# Patient Record
Sex: Female | Born: 1955
Health system: Southern US, Community
[De-identification: ages and names within clinical notes are randomized; demographics above are authoritative.]

## PROBLEM LIST (undated history)

## (undated) DIAGNOSIS — E785 Hyperlipidemia, unspecified: Secondary | ICD-10-CM

## (undated) DIAGNOSIS — G5139 Clonic hemifacial spasm, unspecified: Secondary | ICD-10-CM

## (undated) DIAGNOSIS — I2510 Atherosclerotic heart disease of native coronary artery without angina pectoris: Secondary | ICD-10-CM

## (undated) HISTORY — PX: WISDOM TOOTH EXTRACTION: SHX21

## (undated) HISTORY — PX: TONSILLECTOMY AND ADENOIDECTOMY: SUR1326

## (undated) HISTORY — DX: Atherosclerotic heart disease of native coronary artery without angina pectoris: I25.10

## (undated) HISTORY — PX: RECTAL PROLAPSE REPAIR: SHX759

## (undated) HISTORY — DX: Clonic hemifacial spasm, unspecified: G51.39

## (undated) HISTORY — DX: Hyperlipidemia, unspecified: E78.5

---

## 2014-04-10 ENCOUNTER — Other Ambulatory Visit (HOSPITAL_COMMUNITY)
Admission: RE | Admit: 2014-04-10 | Discharge: 2014-04-10 | Disposition: A | Discharge status: Home / Self Care | Payer: 59 | Source: Ambulatory Visit | Attending: Family Medicine | Admitting: Family Medicine

## 2014-04-10 ENCOUNTER — Ambulatory Visit (INDEPENDENT_AMBULATORY_CARE_PROVIDER_SITE_OTHER): Payer: 59 | Admitting: Family Medicine

## 2014-04-10 ENCOUNTER — Encounter: Payer: Self-pay | Admitting: Family Medicine

## 2014-04-10 VITALS — BP 142/92 | HR 67 | Temp 98.1°F | Ht 63.0 in | Wt 175.5 lb

## 2014-04-10 DIAGNOSIS — Z136 Encounter for screening for cardiovascular disorders: Secondary | ICD-10-CM

## 2014-04-10 DIAGNOSIS — Z1211 Encounter for screening for malignant neoplasm of colon: Secondary | ICD-10-CM

## 2014-04-10 DIAGNOSIS — Z01419 Encounter for gynecological examination (general) (routine) without abnormal findings: Secondary | ICD-10-CM

## 2014-04-10 DIAGNOSIS — Z0000 Encounter for general adult medical examination without abnormal findings: Secondary | ICD-10-CM

## 2014-04-10 DIAGNOSIS — Z1151 Encounter for screening for human papillomavirus (HPV): Secondary | ICD-10-CM | POA: Insufficient documentation

## 2014-04-10 LAB — LIPID PANEL
CHOL/HDL RATIO: 4
Cholesterol: 204 mg/dL — ABNORMAL HIGH (ref 0–200)
HDL: 54.4 mg/dL (ref >39.00)
LDL CALC: 136 mg/dL — AB (ref 0–99)
NONHDL: 149.6
Triglycerides: 70 mg/dL (ref 0.0–149.0)
VLDL: 14 mg/dL (ref 0.0–40.0)

## 2014-04-10 LAB — CBC WITH DIFFERENTIAL/PLATELET
BASOS PCT: 0.6 % (ref 0.0–3.0)
Basophils Absolute: 0 10*3/uL (ref 0.0–0.1)
EOS ABS: 0.2 10*3/uL (ref 0.0–0.7)
EOS PCT: 4.1 % (ref 0.0–5.0)
HCT: 40.9 % (ref 36.0–46.0)
Hemoglobin: 13.9 g/dL (ref 12.0–15.0)
LYMPHS PCT: 22.7 % (ref 12.0–46.0)
Lymphs Abs: 1.1 10*3/uL (ref 0.7–4.0)
MCHC: 34.1 g/dL (ref 30.0–36.0)
MCV: 86.4 fl (ref 78.0–100.0)
MONO ABS: 0.5 10*3/uL (ref 0.1–1.0)
Monocytes Relative: 9.8 % (ref 3.0–12.0)
NEUTROS PCT: 62.8 % (ref 43.0–77.0)
Neutro Abs: 3.1 10*3/uL (ref 1.4–7.7)
Platelets: 226 10*3/uL (ref 150.0–400.0)
RBC: 4.73 Mil/uL (ref 3.87–5.11)
RDW: 14.1 % (ref 11.5–15.5)
WBC: 5 10*3/uL (ref 4.0–10.5)

## 2014-04-10 LAB — COMPREHENSIVE METABOLIC PANEL
ALBUMIN: 4.2 g/dL (ref 3.5–5.2)
ALK PHOS: 81 U/L (ref 39–117)
ALT: 19 U/L (ref 0–35)
AST: 21 U/L (ref 0–37)
BUN: 13 mg/dL (ref 6–23)
CALCIUM: 9.3 mg/dL (ref 8.4–10.5)
CO2: 29 mEq/L (ref 19–32)
Chloride: 104 mEq/L (ref 96–112)
Creatinine, Ser: 0.6 mg/dL (ref 0.4–1.2)
GFR: 107.7 mL/min (ref >60.00)
Glucose, Bld: 110 mg/dL — ABNORMAL HIGH (ref 70–99)
Potassium: 3.8 mEq/L (ref 3.5–5.1)
SODIUM: 139 meq/L (ref 135–145)
TOTAL PROTEIN: 6.9 g/dL (ref 6.0–8.3)
Total Bilirubin: 0.7 mg/dL (ref 0.2–1.2)

## 2014-04-10 LAB — TSH: TSH: 1.56 u[IU]/mL (ref 0.35–4.50)

## 2014-04-10 NOTE — Patient Instructions (Signed)
Great to meet you. We will call you with your lab results.  You can also view them online.  Please think about getting a colonoscopy.  We can order this for you.

## 2014-04-10 NOTE — Assessment & Plan Note (Signed)
Reviewed preventive care protocols, scheduled due services, and updated immunizations Discussed nutrition, exercise, diet, and healthy lifestyle.  Orders Placed This Encounter  Procedures  . Fecal occult blood, imunochemical  . CBC with Differential  . Comprehensive metabolic panel  . Lipid panel  . TSH

## 2014-04-10 NOTE — Progress Notes (Signed)
Subjective:   Patient ID: Betty Smith, female    DOB: 1956/10/06, 58 y.o.   MRN: [id number]  Betty Smith is a pleasant 58 y.o. year old female year old female who presents to clinic today with Establish Care and Annual Exam  on 04/10/2014  HPI:  G12P12 here to establish care and for CPX. Has not seen a doctor in years.  LMP 3 years ago- no h/o postmenopausal bleeding.  Did have a rectal prolapse - [place]  Has not had a mammogram in over 10 years- she is refusing.  Only family h/o CA is breast CA in maternal grand mother who was in her 8170s at the time.  Denies any changes in her bowel habits or blood in her stool.  Has not had a colonoscopy since she turned 52. Denies any dysuria or vaginal discharge.  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  No current outpatient prescriptions on file prior to visit.   No current facility-administered medications on file prior to visit.    No Known Allergies  Past Medical History  Diagnosis Date  . GERD (gastroesophageal reflux disease)   . Hypertension   . Frequent headaches     hx of    Past Surgical History  Procedure Laterality Date  . Cesarean section  1983, 1985  . Tonsillectomy and adenoidectomy    . Wisdom tooth extraction    . Tooth extraction      Family History  Problem Relation Age of Onset  . Arthritis Mother   . Cancer Mother   . Cancer Father   . Cancer Brother   . Stroke Brother   . Cancer Maternal Grandmother     History   Social History  . Marital Status: Married    Spouse Name: N/A    Number of Children: N/A  . Years of Education: N/A   Occupational History  . Not on file.   Social History Main Topics  . Smoking status: Former Games developermoker  . Smokeless tobacco: Never Used  . Alcohol Use: Yes  . Drug Use: No  . Sexual Activity: No   Other Topics Concern  . Not on file   Social History Narrative  . No narrative on file   The PMH, PSH, Social  History, Family History, Medications, and allergies [facility], and have been updated if relevant.    Review of Systems    See HPI Patient reports no  vision/ hearing changes,anorexia, weight change, fever ,adenopathy, persistant / recurrent hoarseness, swallowing issues, chest pain, edema,persistant / recurrent cough, hemoptysis, dyspnea(rest, exertional, paroxysmal nocturnal), gastrointestinal  bleeding (melena, rectal bleeding), abdominal pain, excessive heart burn, GU symptoms(dysuria, hematuria, pyuria, voiding/incontinence  Issues) syncope, focal weakness, severe memory loss, concerning skin lesions, depression, anxiety, abnormal bruising/bleeding, major joint swelling, breast masses or abnormal vaginal bleeding.    Objective:    BP 142/92  Pulse 67  Temp(Src) 98.1 F (36.7 C) (Oral)  Ht 5\' 3"  (1.6 m)  Wt 175 lb 8 oz (79.606 kg)  BMI 31.10 kg/m2  SpO2 97%   Physical Exam    General:  Well-developed,well-nourished,in no acute distress; alert,appropriate and cooperative throughout examination Head:  normocephalic and atraumatic.   Eyes:  vision grossly intact, pupils equal, pupils round, and pupils reactive to light.   Ears:  R ear normal and L ear normal.   Nose:  no external deformity.   Mouth:  good dentition.   Neck:  No deformities, masses, or tenderness noted. Breasts:  No mass, nodules, thickening, tenderness, bulging, retraction, inflamation, nipple discharge or skin changes noted.   Lungs:  Normal respiratory effort, chest expands symmetrically. Lungs are clear to auscultation, no crackles or wheezes. Heart:  Normal rate and regular rhythm. S1 and S2 normal without gallop, murmur, click, rub or other extra sounds. Abdomen:  Bowel sounds positive,abdomen soft and non-tender without masses, organomegaly or hernias noted. Rectal:  no external abnormalities.   Genitalia:  Pelvic Exam:        External: normal female genitalia without lesions or masses         Vagina: normal without lesions or masses        Cervix: normal without lesions or masses        Adnexa: normal bimanual exam without masses or fullness        Uterus: normal by palpation        Pap smear: performed Msk:  No deformity or scoliosis noted of thoracic or lumbar spine.   Extremities:  No clubbing, cyanosis, edema, or deformity noted with normal full range of motion of all joints.   Neurologic:  alert & oriented X3 and gait normal.   Skin:  Intact without suspicious lesions or rashes Cervical Nodes:  No lymphadenopathy noted Axillary Nodes:  No palpable lymphadenopathy Psych:  Cognition and judgment appear intact. Alert and cooperative with normal attention span and concentration. No apparent delusions, illusions, hallucinations      Assessment & Plan:   Encounter for routine gynecological examination  Routine general medical examination at a health care facility No Follow-up on file.

## 2014-04-10 NOTE — Progress Notes (Signed)
Pre visit review using our clinic review tool, if applicable. No additional management support is needed unless otherwise documented below in the visit note. 

## 2014-04-10 NOTE — Assessment & Plan Note (Signed)
Pap smear today.  Declining mammogram. IFOB ordered- wants to think about colonoscopy.

## 2014-04-11 LAB — CYTOLOGY - PAP

## 2014-04-15 ENCOUNTER — Encounter: Payer: Self-pay | Admitting: *Deleted

## 2014-04-16 ENCOUNTER — Other Ambulatory Visit (INDEPENDENT_AMBULATORY_CARE_PROVIDER_SITE_OTHER): Payer: 59

## 2014-04-16 DIAGNOSIS — Z0000 Encounter for general adult medical examination without abnormal findings: Secondary | ICD-10-CM

## 2014-04-16 LAB — FECAL OCCULT BLOOD, IMMUNOCHEMICAL: FECAL OCCULT BLD: NEGATIVE

## 2014-04-16 NOTE — Addendum Note (Signed)
Addended by: Desmond DikeKNIGHT, Renleigh Ouellet H on: 04/16/2014 02:08 PM   Modules accepted: Orders

## 2014-04-17 ENCOUNTER — Encounter: Payer: Self-pay | Admitting: *Deleted

## 2014-09-25 ENCOUNTER — Ambulatory Visit (INDEPENDENT_AMBULATORY_CARE_PROVIDER_SITE_OTHER): Payer: 59 | Admitting: Family Medicine

## 2014-09-25 ENCOUNTER — Encounter: Payer: Self-pay | Admitting: Family Medicine

## 2014-09-25 VITALS — BP 144/86 | HR 68 | Temp 98.1°F | Wt 180.2 lb

## 2014-09-25 DIAGNOSIS — R030 Elevated blood-pressure reading, without diagnosis of hypertension: Secondary | ICD-10-CM | POA: Insufficient documentation

## 2014-09-25 NOTE — Assessment & Plan Note (Signed)
New- BP closer to her normal range/normotensive here today.Likely due to recent stressors and acute anxiety related to her dental work. Advised to cut back on salt intake- given handout on DASH diet, and increasing physical activity. Advised getting a home BP cuff. No rx given today. Follow up in 2 in two months, sooner if symptoms deteriorate.

## 2014-09-25 NOTE — Patient Instructions (Signed)
Great to see you. I am so sorry for your loss.  Have a nice Christmas and say hi to your family for me.  Please come see me after the holidays and we will recheck your blood pressure.  Cut back on salt in your diet and exercise more.  OMRON is my favorite brand of BP machines.  DASH Eating Plan DASH stands for "Dietary Approaches to Stop Hypertension." The DASH eating plan is a healthy eating plan that has been shown to reduce high blood pressure (hypertension). Additional health benefits may include reducing the risk of type 2 diabetes mellitus, heart disease, and stroke. The DASH eating plan may also help with weight loss. WHAT DO I NEED TO KNOW ABOUT THE DASH EATING PLAN? For the DASH eating plan, you will follow these general guidelines:  Choose foods with a percent daily value for sodium of less than 5% (as listed on the food label).  Use salt-free seasonings or herbs instead of table salt or sea salt.  Check with your health care provider or pharmacist before using salt substitutes.  Eat lower-sodium products, often labeled as "lower sodium" or "no salt added."  Eat fresh foods.  Eat more vegetables, fruits, and low-fat dairy products.  Choose whole grains. Look for the word "whole" as the first word in the ingredient list.  Choose fish and skinless chicken or [place]key more often than red meat. Limit fish, poultry, and meat to 6 oz (170 g) each day.  Limit sweets, desserts, sugars, and sugary drinks.  Choose heart-healthy fats.  Limit cheese to 1 oz (28 g) per day.  Eat more home-cooked food and less restaurant, buffet, and fast food.  Limit fried foods.  Cook foods using methods other than frying.  Limit canned vegetables. If you do use them, rinse them well to decrease the sodium.  When eating at a restaurant, ask that your food be prepared with less salt, or no salt if possible. WHAT FOODS CAN I EAT? Seek help from a dietitian for individual calorie  needs. Grains Whole grain or whole wheat bread. Brown rice. Whole grain or whole wheat pasta. Quinoa, bulgur, and whole grain cereals. Low-sodium cereals. Corn or whole wheat flour tortillas. Whole grain cornbread. Whole grain crackers. Low-sodium crackers. Vegetables Fresh or frozen vegetables (raw, steamed, roasted, or grilled). Low-sodium or reduced-sodium tomato and vegetable juices. Low-sodium or reduced-sodium tomato sauce and paste. Low-sodium or reduced-sodium canned vegetables.  Fruits All fresh, canned (in natural juice), or frozen fruits. Meat and Other Protein Products Ground beef (85% or leaner), grass-fed beef, or beef trimmed of fat. Skinless chicken or [place]key. Ground chicken or [place]key. Pork trimmed of fat. All fish and seafood. Eggs. Dried beans, peas, or lentils. Unsalted nuts and seeds. Unsalted canned beans. Dairy Low-fat dairy products, such as skim or 1% milk, 2% or reduced-fat cheeses, low-fat ricotta or cottage cheese, or plain low-fat yogurt. Low-sodium or reduced-sodium cheeses. Fats and Oils Tub margarines without trans fats. Light or reduced-fat mayonnaise and salad dressings (reduced sodium). Avocado. Safflower, olive, or canola oils. Natural peanut or almond butter. Other Unsalted popcorn and pretzels. The items listed above may not be a complete list of recommended foods or beverages. Contact your dietitian for more options. WHAT FOODS ARE NOT RECOMMENDED? Grains White bread. White pasta. White rice. Refined cornbread. Bagels and croissants. Crackers that contain trans fat. Vegetables Creamed or fried vegetables. Vegetables in a cheese sauce. Regular canned vegetables. Regular canned tomato sauce and paste. Regular tomato and vegetable juices.  Fruits Dried fruits. Canned fruit in light or heavy syrup. Fruit juice. Meat and Other Protein Products Fatty cuts of meat. Ribs, chicken wings, bacon, sausage, bologna, salami, chitterlings, fatback, hot dogs, bratwurst,  and packaged luncheon meats. Salted nuts and seeds. Canned beans with salt. Dairy Whole or 2% milk, cream, half-and-half, and cream cheese. Whole-fat or sweetened yogurt. Full-fat cheeses or blue cheese. Nondairy creamers and whipped toppings. Processed cheese, cheese spreads, or cheese curds. Condiments Onion and garlic salt, seasoned salt, table salt, and sea salt. Canned and packaged gravies. Worcestershire sauce. Tartar sauce. Barbecue sauce. Teriyaki sauce. Soy sauce, including reduced sodium. Steak sauce. Fish sauce. Oyster sauce. Cocktail sauce. Horseradish. Ketchup and mustard. Meat flavorings and tenderizers. Bouillon cubes. Hot sauce. Tabasco sauce. Marinades. Taco seasonings. Relishes. Fats and Oils Butter, stick margarine, lard, shortening, ghee, and bacon fat. Coconut, palm kernel, or palm oils. Regular salad dressings. Other Pickles and olives. Salted popcorn and pretzels. The items listed above may not be a complete list of foods and beverages to avoid. Contact your dietitian for more information. WHERE CAN I FIND MORE INFORMATION? National Heart, Lung, and Blood Institute: CableProm[employer].itwww.nhlbi.nih.gov/health/health-topics/topics/dash/ Document Released: 09/16/2011 Document Revised: 02/11/2014 Document Reviewed: 08/01/2013 [facility] Patient Information 2015 [employer] [place]. This information is not intended to replace advice given to you by your health care provider. Make sure you discuss any questions you have with your health care provider.

## 2014-09-25 NOTE — Progress Notes (Signed)
Pre visit review using our clinic review tool, if applicable. No additional management support is needed unless otherwise documented below in the visit note. 

## 2014-09-25 NOTE — Progress Notes (Signed)
Subjective:   Patient ID: Betty OberSusan Bouza, female    DOB: 10-Feb-1956, 58 y.o.   MRN: [id number]  Betty Smith is a pleasant 58 y.o. year old female who presents to clinic today with Hypertension  on 09/25/2014  HPI: Here for ? HTN  No PMH of HTN but has always had a "higher" normal readings- 130s- 140s systolic.  Was 160/100 at dentist. She was very nervous about having dental work done. Asymptomatic other than some eye pain after a very long day at work.  Has not had any since. No HA, blurred vision, CP or SOB. No LE edema.    Has been under more stress- her mother recently diet.  Gained some weight, eating more salt.  BP Readings from Last 3 Encounters:  09/25/14 144/86  04/10/14 142/92   Lab Results  Component Value Date   CREATININE 0.6 04/10/2014   No current outpatient prescriptions on file prior to visit.   No current facility-administered medications on file prior to visit.    No Known Allergies  Past Medical History  Diagnosis Date  . GERD (gastroesophageal reflux disease)   . Hypertension   . Frequent headaches     hx of    Past Surgical History  Procedure Laterality Date  . Cesarean section  1983, 1985  . Tonsillectomy and adenoidectomy    . Wisdom tooth extraction    . Tooth extraction      Family History  Problem Relation Age of Onset  . Arthritis Mother   . Cancer Mother   . Cancer Father   . Cancer Brother   . Stroke Brother   . Cancer Maternal Grandmother     History   Social History  . Marital Status: Married    Spouse Name: N/A    Number of Children: N/A  . Years of Education: N/A   Occupational History  . Not on file.   Social History Main Topics  . Smoking status: Former Games developermoker  . Smokeless tobacco: Never Used  . Alcohol Use: Yes  . Drug Use: No  . Sexual Activity: No   Other Topics Concern  . Not on file   Social History Narrative   The PMH, PSH, Social History, Family History, Medications, and allergies have been  [facility], and have been updated if relevant.     Review of Systems  Constitutional: Negative.   HENT: Negative.   Eyes: Negative.   Respiratory: Negative.   Cardiovascular: Negative.   Gastrointestinal: Negative.   Musculoskeletal: Negative.   Skin: Negative.   Neurological: Negative.   Hematological: Negative.   All other systems reviewed and are negative.      Objective:    BP 144/86 mmHg  Pulse 68  Temp(Src) 98.1 F (36.7 C) (Oral)  Wt 180 lb 4 oz (81.761 kg)  SpO2 98% Wt Readings from Last 3 Encounters:  09/25/14 180 lb 4 oz (81.761 kg)  04/10/14 175 lb 8 oz (79.606 kg)     Physical Exam  Constitutional: She is oriented to person, place, and time. She appears well-developed and well-nourished. No distress.  Eyes: Pupils are equal, round, and reactive to light.  Neck: Normal range of motion.  Cardiovascular: Normal rate.   Pulmonary/Chest: Effort normal.  Neurological: She is alert and oriented to person, place, and time.  Skin: Skin is warm and dry.  Psychiatric: She has a normal mood and affect. Her behavior is normal. Judgment and thought content normal.  Nursing note and vitals reviewed.  Assessment & Plan:   Elevated blood pressure (not hypertension) No Follow-up on file.

## 2015-03-26 ENCOUNTER — Encounter: Payer: Self-pay | Admitting: Internal Medicine

## 2015-03-26 ENCOUNTER — Ambulatory Visit (INDEPENDENT_AMBULATORY_CARE_PROVIDER_SITE_OTHER): Payer: 59 | Admitting: Internal Medicine

## 2015-03-26 VITALS — BP 124/82 | HR 71 | Temp 98.1°F | Wt 167.0 lb

## 2015-03-26 DIAGNOSIS — R0781 Pleurodynia: Secondary | ICD-10-CM | POA: Diagnosis not present

## 2015-03-26 NOTE — Progress Notes (Signed)
Subjective:    Patient ID: Betty Smith, female    DOB: 09-28-1956, 59 y.o.   MRN: [id number]  HPI  Pt presents to the clinic today with c/o a fall. This occurred 2 days ago. She was walking toward her house, when she tripped and fell forward. She did catch herself with her hands and knees. She now c/o pain in her left side/back. The pain is sharp and shooting. The pain hurts worse with deep breath and certain movements. She has tried topical menthol with some relief. She has no history of breathing problems. She is concerned that she may have broken a rib.  Review of Systems      Past Medical History  Diagnosis Date  . GERD (gastroesophageal reflux disease)   . Hypertension   . Frequent headaches     hx of    No current outpatient prescriptions on file.   No current facility-administered medications for this visit.    No Known Allergies  Family History  Problem Relation Age of Onset  . Arthritis Mother   . Cancer Mother   . Cancer Father   . Cancer Brother   . Stroke Brother   . Cancer Maternal Grandmother     History   Social History  . Marital Status: Married    Spouse Name: N/A  . Number of Children: N/A  . Years of Education: N/A   Occupational History  . Not on file.   Social History Main Topics  . Smoking status: Former Games developer  . Smokeless tobacco: Never Used  . Alcohol Use: Yes  . Drug Use: No  . Sexual Activity: No   Other Topics Concern  . Not on file   Social History Narrative     Constitutional: Denies fever, malaise, fatigue, headache or abrupt weight changes.  Respiratory: Denies difficulty breathing, shortness of breath, cough or sputum production.  Cardiovascular: Denies chest pain, chest tightness, palpitations or swelling in the hands or feet.  Gastrointestinal: Denies abdominal pain, bloating, constipation, diarrhea or blood in the stool.  GU: Denies urgency, frequency, pain with urination, burning sensation, blood in urine, odor  or discharge. Musculoskeletal: Pt reports pain in left side/back. Denies decrease in range of motion, difficulty with gait, or joint pain and swelling.  Skin: Denies redness, rashes, lesions or ulcercations.    No other specific complaints in a complete review of systems (except as listed in HPI above).  Objective:   Physical Exam    BP 124/82 mmHg  Pulse 71  Temp(Src) 98.1 F (36.7 C) (Oral)  Wt 167 lb (75.751 kg)  SpO2 98% Wt Readings from Last 3 Encounters:  03/26/15 167 lb (75.751 kg)  09/25/14 180 lb 4 oz (81.761 kg)  04/10/14 175 lb 8 oz (79.606 kg)    General: Appears her stated age, well developed, well nourished in NAD. Skin: Warm, dry and intact. No bruising noted. Cardiovascular: Normal rate and rhythm. S1,S2 noted.   Pulmonary/Chest: Normal effort and positive vesicular breath sounds. No respiratory distress. No wheezes, rales or ronchi noted.  Abdomen: Soft and nontender. Normal bowel sounds, no bruits noted. No distention or masses noted.  Musculoskeletal: Pain reproduced with palpation of the left lateral back/side, directly over the ribs. Some laxity noted but deformity felt.  BMET    Component Value Date/Time   NA 139 04/10/2014 1142   K 3.8 04/10/2014 1142   CL 104 04/10/2014 1142   CO2 29 04/10/2014 1142   GLUCOSE 110* 04/10/2014 1142  BUN 13 04/10/2014 1142   CREATININE 0.6 04/10/2014 1142   CALCIUM 9.3 04/10/2014 1142    Lipid Panel     Component Value Date/Time   CHOL 204* 04/10/2014 1142   TRIG 70.0 04/10/2014 1142   HDL 54.40 04/10/2014 1142   CHOLHDL 4 04/10/2014 1142   VLDL 14.0 04/10/2014 1142   LDLCALC 136* 04/10/2014 1142    CBC    Component Value Date/Time   WBC 5.0 04/10/2014 1142   RBC 4.73 04/10/2014 1142   HGB 13.9 04/10/2014 1142   HCT 40.9 04/10/2014 1142   PLT 226.0 04/10/2014 1142   MCV 86.4 04/10/2014 1142   MCHC 34.1 04/10/2014 1142   RDW 14.1 04/10/2014 1142   LYMPHSABS 1.1 04/10/2014 1142   MONOABS 0.5  04/10/2014 1142   EOSABS 0.2 04/10/2014 1142   BASOSABS 0.0 04/10/2014 1142    Hgb A1C No results found for: HGBA1C     Assessment & Plan:   Left side rib pain:  Likely a rib fracture Discussed the possibility of xray the left side of the ribs, but why expose her to the radiation when it would not change the treatment Discussed why we do not bind the ribs anymore, secondary to increased risk for pneumonia. She will continue with the menthol rub, Ibuprofen and a heating pad Reassured her that this should resolve within 3-4 weeks  RTC as needed or if symptoms persist or worsen

## 2015-03-26 NOTE — Patient Instructions (Signed)

## 2015-05-01 ENCOUNTER — Telehealth: Payer: Self-pay

## 2015-05-01 NOTE — Telephone Encounter (Signed)
Left a message for patient to return my call about having a Mammogram set up. Will await call back.  

## 2015-05-22 ENCOUNTER — Other Ambulatory Visit: Payer: Self-pay | Admitting: Internal Medicine

## 2015-05-22 DIAGNOSIS — Z0000 Encounter for general adult medical examination without abnormal findings: Secondary | ICD-10-CM

## 2015-06-02 ENCOUNTER — Other Ambulatory Visit (INDEPENDENT_AMBULATORY_CARE_PROVIDER_SITE_OTHER): Payer: 59

## 2015-06-02 DIAGNOSIS — Z0000 Encounter for general adult medical examination without abnormal findings: Secondary | ICD-10-CM

## 2015-06-02 LAB — COMPREHENSIVE METABOLIC PANEL
ALT: 13 U/L (ref 0–35)
AST: 15 U/L (ref 0–37)
Albumin: 4.1 g/dL (ref 3.5–5.2)
Alkaline Phosphatase: 89 U/L (ref 39–117)
BUN: 13 mg/dL (ref 6–23)
CHLORIDE: 104 meq/L (ref 96–112)
CO2: 28 meq/L (ref 19–32)
CREATININE: 0.61 mg/dL (ref 0.40–1.20)
Calcium: 9.3 mg/dL (ref 8.4–10.5)
GFR: 107.26 mL/min (ref >60.00)
Glucose, Bld: 98 mg/dL (ref 70–99)
Potassium: 4 mEq/L (ref 3.5–5.1)
Sodium: 140 mEq/L (ref 135–145)
Total Bilirubin: 0.5 mg/dL (ref 0.2–1.2)
Total Protein: 6.5 g/dL (ref 6.0–8.3)

## 2015-06-02 LAB — LIPID PANEL
CHOL/HDL RATIO: 4
Cholesterol: 195 mg/dL (ref 0–200)
HDL: 52.6 mg/dL (ref >39.00)
LDL CALC: 129 mg/dL — AB (ref 0–99)
NonHDL: 142.07
Triglycerides: 67 mg/dL (ref 0.0–149.0)
VLDL: 13.4 mg/dL (ref 0.0–40.0)

## 2015-06-02 LAB — CBC
HCT: 41 % (ref 36.0–46.0)
Hemoglobin: 13.9 g/dL (ref 12.0–15.0)
MCHC: 33.9 g/dL (ref 30.0–36.0)
MCV: 86.8 fl (ref 78.0–100.0)
Platelets: 221 10*3/uL (ref 150.0–400.0)
RBC: 4.73 Mil/uL (ref 3.87–5.11)
RDW: 13.8 % (ref 11.5–15.5)
WBC: 5.1 10*3/uL (ref 4.0–10.5)

## 2015-06-02 LAB — TSH: TSH: 2.3 u[IU]/mL (ref 0.35–4.50)

## 2015-06-05 ENCOUNTER — Ambulatory Visit (INDEPENDENT_AMBULATORY_CARE_PROVIDER_SITE_OTHER): Payer: 59 | Admitting: Family Medicine

## 2015-06-05 ENCOUNTER — Encounter: Payer: Self-pay | Admitting: Family Medicine

## 2015-06-05 VITALS — BP 128/82 | HR 68 | Temp 98.3°F | Ht 63.0 in | Wt 171.5 lb

## 2015-06-05 DIAGNOSIS — Z1211 Encounter for screening for malignant neoplasm of colon: Secondary | ICD-10-CM | POA: Diagnosis not present

## 2015-06-05 DIAGNOSIS — Z0000 Encounter for general adult medical examination without abnormal findings: Secondary | ICD-10-CM | POA: Diagnosis not present

## 2015-06-05 DIAGNOSIS — Z01419 Encounter for gynecological examination (general) (routine) without abnormal findings: Secondary | ICD-10-CM

## 2015-06-05 NOTE — Progress Notes (Signed)
Subjective:   Patient ID: Betty Smith, female    DOB: [birth year], 59 y.o.   MRN: [id number]  Megean Fabio is a pleasant 59 y.o. year old female who presents to clinic today with Annual Exam and Flank Pain  on 06/05/2015  HPI:  G12P12 - doing well.  LMP 4 or 5  years ago- no h/o postmenopausal bleeding.  Did have a rectal prolapse - had surgery in 2003 in [place]. Pap smear negative last year- 04/15/14.  Has not had a mammogram in over 10 years- she is refusing. Only family h/o CA is breast CA in maternal grand mother who was in her 29s at the time.  Has not had a colonoscopy since she turned 52. IFOB negative 04/16/2014, willing to continue yearly stool cards. Denies any changes in her bowel habits or blood in her stool.  Denies any dysuria or vaginal discharge.  Has been working on losing weight.  She remains very active. Wt Readings from Last 3 Encounters:  06/05/15 171 lb 8 oz (77.792 kg)  03/26/15 167 lb (75.751 kg)  09/25/14 180 lb 4 oz (81.761 kg)    Lab Results  Component Value Date   CREATININE 0.61 06/02/2015    Lab Results  Component Value Date   WBC 5.1 06/02/2015   HGB 13.9 06/02/2015   HCT 41.0 06/02/2015   MCV 86.8 06/02/2015   PLT 221.0 06/02/2015   Lab Results  Component Value Date   TSH 2.30 06/02/2015   Lab Results  Component Value Date   CHOL 195 06/02/2015   HDL 52.60 06/02/2015   LDLCALC 129* 06/02/2015   TRIG 67.0 06/02/2015   CHOLHDL 4 06/02/2015   Lab Results  Component Value Date   ALT 13 06/02/2015   AST 15 06/02/2015   ALKPHOS 89 06/02/2015   BILITOT 0.5 06/02/2015    No CP or SOB- 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  No current outpatient prescriptions on file prior to visit.   No current facility-administered medications on file prior to visit.    No Known Allergies  Past Medical History  Diagnosis Date  . GERD (gastroesophageal reflux disease)   . Hypertension   . Frequent  headaches     hx of    Past Surgical History  Procedure Laterality Date  . Cesarean section  1983, 1985  . Tonsillectomy and adenoidectomy    . Wisdom tooth extraction    . Tooth extraction      Family History  Problem Relation Age of Onset  . Arthritis Mother   . Cancer Mother   . Cancer Father   . Cancer Brother   . Stroke Brother   . Cancer Maternal Grandmother     Social History   Social History  . Marital Status: Married    Spouse Name: N/A  . Number of Children: N/A  . Years of Education: N/A   Occupational History  . Not on file.   Social History Main Topics  . Smoking status: Former Games developer  . Smokeless tobacco: Never Used  . Alcohol Use: Yes  . Drug Use: No  . Sexual Activity: No   Other Topics Concern  . Not on file   Social History Narrative   The PMH, PSH, Social History, Family History, Medications, and allergies have been reviewed in [facility], and have been updated if relevant.  Review of Systems  Constitutional: Negative.   HENT: Negative.   Eyes: Negative.   Respiratory: Negative.  Cardiovascular: Negative.   Gastrointestinal: Negative.   Endocrine: Negative.   Genitourinary: Negative.   Musculoskeletal: Negative.   Skin: Negative.   Allergic/Immunologic: Negative.   Neurological: Negative.   Hematological: Negative.   Psychiatric/Behavioral: Negative.   All other systems reviewed and are negative.      Objective:    BP 128/82 mmHg  Pulse 68  Temp(Src) 98.3 F (36.8 C) (Oral)  Ht  (1.6 m)  Wt 171 lb 8 oz (77.792 kg)  BMI 30.39 kg/m2  SpO2 98%  Wt Readings from Last 3 Encounters:  06/05/15 171 lb 8 oz (77.792 kg)  03/26/15 167 lb (75.751 kg)  09/25/14 180 lb 4 oz (81.761 kg)    Physical Exam  Constitutional: She is oriented to person, place, and time. She appears well-developed and well-nourished. No distress.  HENT:  Head: Normocephalic and atraumatic.  Eyes: Conjunctivae are normal.  Neck: Normal range of motion.  Neck supple.  Cardiovascular: Normal rate, regular rhythm and normal heart sounds.   Pulmonary/Chest: Effort normal. Right breast exhibits no inverted nipple, no mass, no nipple discharge, no skin change and no tenderness. Left breast exhibits no inverted nipple, no mass, no nipple discharge, no skin change and no tenderness. Breasts are symmetrical.  Abdominal: Soft. Bowel sounds are normal. She exhibits no distension and no mass. There is no tenderness. There is no rebound and no guarding.  Musculoskeletal: Normal range of motion. She exhibits no edema.  Neurological: She is alert and oriented to person, place, and time. No cranial nerve deficit.  Skin: Skin is warm and dry.  Psychiatric: She has a normal mood and affect. Her behavior is normal. Judgment and thought content normal.  Nursing note and vitals reviewed.         Assessment & Plan:   Well woman exam No Follow-up on file.

## 2015-06-05 NOTE — Assessment & Plan Note (Signed)
Reviewed preventive care protocols, scheduled due services, and updated immunizations Discussed nutrition, exercise, diet, and healthy lifestyle.  IFOB.  Declines mammogram, colonoscopy, Hep C, HIV screening. Also declines tetanus immunization.

## 2015-06-05 NOTE — Progress Notes (Signed)
Pre visit review using our clinic review tool, if applicable. No additional management support is needed unless otherwise documented below in the visit note. 

## 2015-09-09 ENCOUNTER — Encounter: Payer: Self-pay | Admitting: Family Medicine

## 2015-09-09 ENCOUNTER — Ambulatory Visit (INDEPENDENT_AMBULATORY_CARE_PROVIDER_SITE_OTHER): Payer: 59 | Admitting: Family Medicine

## 2015-09-09 VITALS — BP 128/74 | HR 67 | Temp 98.4°F | Wt 178.0 lb

## 2015-09-09 DIAGNOSIS — G518 Other disorders of facial nerve: Secondary | ICD-10-CM

## 2015-09-09 DIAGNOSIS — G5139 Clonic hemifacial spasm, unspecified: Secondary | ICD-10-CM | POA: Insufficient documentation

## 2015-09-09 NOTE — Assessment & Plan Note (Signed)
New- progressive. Refer to neurology for EMG and further work up. The patient indicates understanding of these issues and agrees with the plan.

## 2015-09-09 NOTE — Progress Notes (Signed)
Pre visit review using our clinic review tool, if applicable. No additional management support is needed unless otherwise documented below in the visit note. 

## 2015-09-09 NOTE — Progress Notes (Signed)
   Subjective:    Patient ID: Betty Smith, female    DOB: April 11, 1956, 59 y.o.   MRN: [id number]  HPI 4 months of left sided facial spasms.  Started with her eye only- I advised that she cut back on coffee.  Now, she feels like her entire left side of her face is twitching constantly, worse with chewing and talking.  Feels left side of her face is throbbing, not necessarily numb.  Cutting back on caffeine has not helped.  It is now progressing.  No drooling or difficulty swallowing.  No hearing loss or blurred vision.  No twitching or weakness of other body parts.  Never had anything like this in past.  No current outpatient prescriptions on file prior to visit.   No current facility-administered medications on file prior to visit.    No Known Allergies  Past Medical History  Diagnosis Date  . GERD (gastroesophageal reflux disease)   . Hypertension   . Frequent headaches     hx of    Past Surgical History  Procedure Laterality Date  . Cesarean section  1983, 1985  . Tonsillectomy and adenoidectomy    . Wisdom tooth extraction    . Tooth extraction      Family History  Problem Relation Age of Onset  . Arthritis Mother   . Cancer Mother   . Cancer Father   . Cancer Brother   . Stroke Brother   . Cancer Maternal Grandmother     Social History   Social History  . Marital Status: Married    Spouse Name: N/A  . Number of Children: N/A  . Years of Education: N/A   Occupational History  . Not on file.   Social History Main Topics  . Smoking status: Former Games developermoker  . Smokeless tobacco: Never Used  . Alcohol Use: Yes  . Drug Use: No  . Sexual Activity: No   Other Topics Concern  . Not on file   Social History Narrative   The PMH, PSH, Social History, Family History, Medications, and [facility], and have been updated if relevant.    Review of Systems  Constitutional: Negative.   HENT: Negative.   Eyes: Negative.     Gastrointestinal: Negative.   Genitourinary: Negative.   Musculoskeletal: Negative.   Neurological: Positive for tremors and facial asymmetry. Negative for dizziness, seizures, syncope, speech difficulty, weakness, light-headedness, numbness and headaches.  Psychiatric/Behavioral: Negative.   All other systems reviewed and are negative.      Objective:   Physical Exam  Constitutional: She appears well-developed and well-nourished. No distress.  HENT:  Head: Normocephalic.  Eyes: Conjunctivae are normal.  Neck: Normal range of motion.  Cardiovascular: Normal rate.   Pulmonary/Chest: Effort normal.  Musculoskeletal: Normal range of motion.  Neurological:  Visible left occular fasciculations with ? Facial droop when she talks, not when she smiles. CN II- XII otherwise in tack  Skin: Skin is warm and dry.  Psychiatric: She has a normal mood and affect. Her behavior is normal. Judgment and thought content normal.  Nursing note and vitals reviewed.  BP 128/74 mmHg  Pulse 67  Temp(Src) 98.4 F (36.9 C) (Oral)  Wt 178 lb (80.74 kg)  SpO2 98%        Assessment & Plan:

## 2015-09-09 NOTE — Patient Instructions (Signed)
Good to see you. I am referring you to a see a neurologist and you should hear from our office in the next couple of days.

## 2015-09-16 ENCOUNTER — Ambulatory Visit: Payer: 59 | Admitting: Neurology

## 2015-09-29 ENCOUNTER — Encounter: Payer: Self-pay | Admitting: Neurology

## 2015-09-29 ENCOUNTER — Ambulatory Visit (INDEPENDENT_AMBULATORY_CARE_PROVIDER_SITE_OTHER): Payer: 59 | Admitting: Neurology

## 2015-09-29 VITALS — BP 138/90 | HR 83 | Ht 63.5 in | Wt 180.0 lb

## 2015-09-29 DIAGNOSIS — G518 Other disorders of facial nerve: Secondary | ICD-10-CM | POA: Diagnosis not present

## 2015-09-29 DIAGNOSIS — G5139 Clonic hemifacial spasm, unspecified: Secondary | ICD-10-CM

## 2015-09-29 NOTE — Progress Notes (Signed)
Betty Smith was seen today in neurologic consultation at the request of Ruthe Mannan, MD.  The consultation is for the evaluation of L hemifacial spasm x 4-6 months.  Pt notices facial twitch seemed to start in the eye but now it seems to involve the L cheek as well.  It nevers involves the R face.  Eating, drinking, talking, stress may set off the sx's.  Physically pulling the face may calm the sx's down for a short period of time.  It has gotten slightly worse with time.  She has no speech changes.  She has no lateralizing weakness or paresthesias.  Neuroimaging has not previously been performed.     ALLERGIES:  No Known Allergies  CURRENT MEDICATIONS:  Outpatient Encounter Prescriptions as of 09/29/2015  Medication Sig  . co-enzyme Q-10 30 MG capsule Take 30 mg by mouth daily.  . Multiple Vitamin (MULTIVITAMIN) tablet Take 1 tablet by mouth daily.   No facility-administered encounter medications on file as of 09/29/2015.    PAST MEDICAL HISTORY:   Past Medical History  Diagnosis Date  . Hemifacial spasm     left    PAST SURGICAL HISTORY:   Past Surgical History  Procedure Laterality Date  . Cesarean section  1983, 1985  . Tonsillectomy and adenoidectomy    . Wisdom tooth extraction    . Rectal prolapse repair      SOCIAL HISTORY:   Social History   Social History  . Marital Status: Married    Spouse Name: N/A  . Number of Children: N/A  . Years of Education: N/A   Occupational History  . Not on file.   Social History Main Topics  . Smoking status: Former Smoker    Quit date: 09/28/1990  . Smokeless tobacco: Never Used  . Alcohol Use: 0.0 oz/week    0 Standard drinks or equivalent per week     Comment: couple drinks a week  . Drug Use: No  . Sexual Activity: No   Other Topics Concern  . Not on file   Social History Narrative    FAMILY HISTORY:   Family Status  Relation Status Death Age  . Mother Deceased     esophageal cancer  . Father Deceased      pancreatic cancer  . Sister Alive     spinal stenosis  . Brother Deceased     pancreatic cancer  . Son Alive     12 kids healthy  . Daughter Alive     12 kids healthy    ROS:  A complete 10 system review of systems was obtained and was unremarkable apart from what is mentioned above.  PHYSICAL EXAMINATION:    VITALS:   Filed Vitals:   09/29/15 0739  BP: 138/90  Pulse: 83  Height: 5' 3.5" (1.613 m)  Weight: 180 lb (81.647 kg)    GEN:  Normal appears female in no acute distress.  Appears stated age. HEENT:  Normocephalic, atraumatic. The mucous membranes are moist. The superficial temporal arteries are without ropiness or tenderness. Cardiovascular: Regular rate and rhythm. Lungs: Clear to auscultation bilaterally. Neck/Heme: There are no carotid bruits noted bilaterally.  NEUROLOGICAL: Orientation:  The patient is alert and oriented x 3.  Fund of knowledge is appropriate.  Recent and remote memory intact.  Attention span and concentration normal.   .  Repeats and names without difficulty. Cranial nerves: There is good facial symmetry. The pupils are equal round and reactive to light bilaterally. Fundoscopic exam  reveals clear disc margins bilaterally. Extraocular muscles are intact and visual fields are full to confrontational testing. Speech is fluent and clear. Soft palate rises symmetrically and there is no tongue deviation. Hearing is intact to conversational tone. Tone: Tone is good throughout. Sensation: Sensation is intact to light touch and pinprick throughout (facial, trunk, extremities). Vibration is intact at the bilateral big toe. There is no extinction with double simultaneous stimulation. There is no sensory dermatomal level identified. Coordination:  The patient has no difficulty with RAM's or FNF bilaterally. Motor: Strength is 5/5 in the bilateral upper and lower extremities.  Shoulder shrug is equal and symmetric. There is no pronator drift.  There are no  fasciculations noted. DTR's: Deep tendon reflexes are 2/4 at the bilateral biceps, triceps, brachioradialis, patella and achilles.  Plantar responses are downgoing bilaterally. Gait and Station: The patient is able to ambulate without difficulty. The patient is able to heel toe walk without any difficulty. The patient is able to ambulate in a tandem fashion. The patient is able to stand in the Romberg position. Abnormal movements: There is an occasional twitch and closure of the left eye.  It slightly involves the left zygomaticus.  It is worse when the muscles of facial expression are activated.   IMPRESSION/PLAN  1.  Left hemifacial spasm  -I talked to her about nature and etiology as well as pathophysiology.  Told her that this could slowly get worse with the course of time.  Told her that we often do not identify an etiology, but we for etiologies, including vascular etiologies and tumors.  She was agreeable to an MRI of the brain.  -I talked to her about various treatments.  Told her that oral medications usually do not work.  She really did not want oral medication anyway.  I talked to her about Botox as well as its risks and benefits.  Talk to her about the black box warning.  Talked to her about the risk of facial asymmetry with Botox, and also talked to her about the risk of facial asymmetry with long-term untreated left hemifacial spasm.  Much greater than 50% of visit in counseling.  She will think about options and let me know what she decides to do, if anything.  In the meantime, we will do the MRI brain and call her with the results.

## 2015-09-29 NOTE — Patient Instructions (Signed)
1. We have scheduled you at [facility] for your OPEN MRI on Wednesday 10/01/2015 at 2:00 pm. Please arrive 30 minutes prior and [address] If you need to reschedule for any reason please call [phone]. 2. Please let us know if you are interested in Botox.

## 2015-10-01 ENCOUNTER — Ambulatory Visit: Payer: 59 | Admitting: Neurology

## 2015-10-01 ENCOUNTER — Other Ambulatory Visit (INDEPENDENT_AMBULATORY_CARE_PROVIDER_SITE_OTHER): Payer: 59

## 2015-10-01 DIAGNOSIS — Z1211 Encounter for screening for malignant neoplasm of colon: Secondary | ICD-10-CM

## 2015-10-01 LAB — FECAL OCCULT BLOOD, IMMUNOCHEMICAL: Fecal Occult Bld: NEGATIVE

## 2015-10-02 ENCOUNTER — Encounter: Payer: Self-pay | Admitting: *Deleted

## 2015-10-03 ENCOUNTER — Telehealth: Payer: Self-pay | Admitting: Neurology

## 2015-10-03 DIAGNOSIS — G5139 Clonic hemifacial spasm, unspecified: Secondary | ICD-10-CM

## 2015-10-03 DIAGNOSIS — R9089 Other abnormal findings on diagnostic imaging of central nervous system: Secondary | ICD-10-CM

## 2015-10-03 NOTE — Telephone Encounter (Signed)
Patient made aware of results.   We have scheduled you at [facility] for your OPEN MRI on 10/07/2015 at 8:15 am. Please arrive 30 minutes prior and [address] If you need to reschedule for any reason please call [phone].  LMOM letting patient know appt date/time. To call back with any questions. Order faxed to [facility].

## 2015-10-03 NOTE — Telephone Encounter (Signed)
MRI with and without at [facility]:  Few rare T2 hyperintensities.  Dominant L vertebral is torturous extending ventral to L IAC and cannot tell if there is neurovasc compression of 7th nerve there.  Recommend send back for MRI without contrast of skull base with high resolution attention to IAC.  Jade, please let pt know that she has a torturous (winding) blood vessel that they didn't get a good look at and cannot tell if it touches the nerve causing her sx's and need better look with different MRI views.  If agreeable, please send order (will probably need new auth)

## 2015-10-03 NOTE — Telephone Encounter (Signed)
PT returned your call/Dawn °

## 2015-10-20 ENCOUNTER — Encounter: Payer: Self-pay | Admitting: Neurology

## 2015-10-21 ENCOUNTER — Ambulatory Visit: Payer: 59 | Admitting: Neurology

## 2015-10-21 ENCOUNTER — Ambulatory Visit (INDEPENDENT_AMBULATORY_CARE_PROVIDER_SITE_OTHER): Payer: 59 | Admitting: Neurology

## 2015-10-21 ENCOUNTER — Encounter: Payer: Self-pay | Admitting: Neurology

## 2015-10-21 ENCOUNTER — Telehealth: Payer: Self-pay | Admitting: Neurology

## 2015-10-21 VITALS — BP 128/74 | HR 99 | Ht 63.0 in | Wt 186.0 lb

## 2015-10-21 DIAGNOSIS — R930 Abnormal findings on diagnostic imaging of skull and head, not elsewhere classified: Secondary | ICD-10-CM

## 2015-10-21 DIAGNOSIS — R9089 Other abnormal findings on diagnostic imaging of central nervous system: Secondary | ICD-10-CM

## 2015-10-21 DIAGNOSIS — G518 Other disorders of facial nerve: Secondary | ICD-10-CM | POA: Diagnosis not present

## 2015-10-21 DIAGNOSIS — G5139 Clonic hemifacial spasm, unspecified: Secondary | ICD-10-CM

## 2015-10-21 NOTE — Progress Notes (Signed)
Betty Smith was seen today in neurologic consultation at the request of Betty Mannan, MD.  The consultation is for the evaluation of L hemifacial spasm x 4-6 months.  Pt notices facial twitch seemed to start in the eye but now it seems to involve the L cheek as well.  It nevers involves the R face.  Eating, drinking, talking, stress may set off the sx's.  Physically pulling the face may calm the sx's down for a short period of time.  It has gotten slightly worse with time.  She has no speech changes.  She has no lateralizing weakness or paresthesias.  Neuroimaging has not previously been performed.   10/20/14 update:  The patient is following up today for follow-up regarding her hemifacial spasm.  She did have an MRI of the brain since our last visit.  This demonstrated an elongated and tortuous left vertebral artery that protruded up into the left CP angle and contacted the left cranial nerve VII and VIII.  She continues to have facial spasm on the left.  Admits to some awakening in the AM with hand paresthesias.  Works at [employer].    ALLERGIES:  No Known Allergies  CURRENT MEDICATIONS:  Outpatient Encounter Prescriptions as of 10/21/2015  Medication Sig  . co-enzyme Q-10 30 MG capsule Take 30 mg by mouth daily.  . Multiple Vitamin (MULTIVITAMIN) tablet Take 1 tablet by mouth daily.   No facility-administered encounter medications on file as of 10/21/2015.    PAST MEDICAL HISTORY:   Past Medical History  Diagnosis Date  . Hemifacial spasm     left    PAST SURGICAL HISTORY:   Past Surgical History  Procedure Laterality Date  . Cesarean section  1983, 1985  . Tonsillectomy and adenoidectomy    . Wisdom tooth extraction    . Rectal prolapse repair      SOCIAL HISTORY:   Social History   Social History  . Marital Status: Married    Spouse Name: N/A  . Number of Children: N/A  . 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 Smoking status: Former Smoker    Quit date: 09/28/1990  . Smokeless tobacco: Never Used  . Alcohol Use: 0.0 oz/week    0 Standard drinks or equivalent per week     Comment: couple drinks a week (max)  . Drug Use: No  . Sexual Activity: No   Other Topics Concern  . Not on file   Social History Narrative    FAMILY HISTORY:   Family Status  Relation Status Death Age  . Mother Deceased     esophageal cancer  . Father Deceased     pancreatic cancer  . Sister Alive     spinal stenosis  . Brother Deceased     pancreatic cancer  . Son Alive     8, healthy  . Daughter Alive     4, healthy    ROS:  A complete 10 system review of systems was obtained and was unremarkable apart from what is mentioned above.  PHYSICAL EXAMINATION:    VITALS:   Filed Vitals:   10/21/15 1055  BP: 128/74  Pulse: 99  Height: 5\' 3"  (1.6 m)  Weight: 186 lb (84.369 kg)    GEN:  Normal appears female in no acute distress.  Appears stated age. HEENT:  Normocephalic, atraumatic. The mucous  membranes are moist. The superficial temporal arteries are without ropiness or tenderness. Cardiovascular: Regular rate and rhythm. Lungs: Clear to auscultation bilaterally. Neck/Heme: There are no carotid bruits noted bilaterally.  NEUROLOGICAL: Orientation:  The patient is alert and oriented x 3.  Fund of knowledge is appropriate.  Recent and remote memory intact.  Attention span and concentration normal.   .  Repeats and names without difficulty. Cranial nerves: There is good facial symmetry.  Speech is fluent and clear. Soft palate rises symmetrically and there is no tongue deviation. Hearing is intact to conversational tone. Tone: Tone is good throughout. Sensation: Sensation is intact to light touch and pinprick throughout (facial, trunk, extremities). Vibration is intact at the bilateral big toe. There is no extinction with double simultaneous stimulation. There is no  sensory dermatomal level identified. Coordination:  The patient has no difficulty with RAM's or FNF bilaterally. Motor: Strength is 5/5 in the bilateral upper and lower extremities.  Shoulder shrug is equal and symmetric. There is no pronator drift.  There are no fasciculations noted. Gait and Station: The patient is able to ambulate without difficulty.  Abnormal movements: There is an occasional twitch and closure of the left eye.  It slightly involves the left zygomaticus.  It is worse when the muscles of facial expression are activated.   IMPRESSION/PLAN  1.  Left hemifacial spasm  -Talked with the patient about the fact that her MRI demonstrated vertebral artery contact with cranial nerve VII (and VIII), which is the likely etiology of the hemifacial spasm.  Because of the location of this, I am not sure that much can be done but nonetheless I recommended consult with Dr. Conchita Smith.  She did not wish to proceed with this.  MRI did demonstrate multiple punctate T2 hyperintensities but I don't think that these are at all related.  -I talked to her about various treatments.  Told her that botox is still an option.  She wants to hold on that.  -she asked for a copy of the MRI and was given that.  -recommend repeating MRI in 9 months to 1 year (if stable and no changes) since doesn't wish to go for neurosx consult 2.  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  Wants to hold EMG for now.

## 2015-10-21 NOTE — Telephone Encounter (Signed)
Patient made aware- okay with repeat scan. Put on reminder list to call in a few months.

## 2015-10-21 NOTE — Telephone Encounter (Signed)
-----   Message from Octaviano Battyebecca S Tat, DO sent at 10/21/2015 11:41 AM EST ----- Lesly RubensteinJade, I know that she did not want neurosx consult but since she doesn't, lets repeat the scan in 9 months to a year for comparison if pt okay

## 2015-11-10 ENCOUNTER — Encounter: Payer: Self-pay | Admitting: Neurology

## 2016-07-12 ENCOUNTER — Telehealth: Payer: Self-pay | Admitting: Family Medicine

## 2016-07-12 ENCOUNTER — Encounter: Payer: Self-pay | Admitting: Family Medicine

## 2016-07-12 ENCOUNTER — Ambulatory Visit (INDEPENDENT_AMBULATORY_CARE_PROVIDER_SITE_OTHER): Payer: 59 | Admitting: Family Medicine

## 2016-07-12 DIAGNOSIS — L988 Other specified disorders of the skin and subcutaneous tissue: Secondary | ICD-10-CM

## 2016-07-12 DIAGNOSIS — N649 Disorder of breast, unspecified: Secondary | ICD-10-CM

## 2016-07-12 LAB — COMPREHENSIVE METABOLIC PANEL
ALBUMIN: 4.1 g/dL (ref 3.5–5.2)
ALK PHOS: 82 U/L (ref 39–117)
ALT: 18 U/L (ref 0–35)
AST: 20 U/L (ref 0–37)
BUN: 15 mg/dL (ref 6–23)
CALCIUM: 9.4 mg/dL (ref 8.4–10.5)
CO2: 33 mEq/L — ABNORMAL HIGH (ref 19–32)
Chloride: 104 mEq/L (ref 96–112)
Creatinine, Ser: 0.7 mg/dL (ref 0.40–1.20)
GFR: 91.15 mL/min (ref >60.00)
Glucose, Bld: 114 mg/dL — ABNORMAL HIGH (ref 70–99)
POTASSIUM: 4 meq/L (ref 3.5–5.1)
Sodium: 143 mEq/L (ref 135–145)
TOTAL PROTEIN: 6.7 g/dL (ref 6.0–8.3)
Total Bilirubin: 0.4 mg/dL (ref 0.2–1.2)

## 2016-07-12 LAB — SEDIMENTATION RATE: SED RATE: 3 mm/h (ref 0–30)

## 2016-07-12 NOTE — Progress Notes (Signed)
Subjective:   Patient ID: Betty Smith, female    DOB: [birth year], 58 y.o.   MRN: [id number]  Betty OberSusan Toops is a pleasant 60 y.o. year old female who presents to clinic today with check spot on breast (left for 2 months)  on 07/12/2016  HPI:  Skin lesion on left breast- noticed it two months ago.  Has grown in size.  Has never been raised, itchy or painful.  Tried OTC "athlete's foot cream" without any improvement of symptoms.  No fevers, joint pain, other rashes or tick bites that she is aware of.  No photophobia.  Has had some twinges of left breast pain intermittently since she noticed this.   Current Outpatient Prescriptions on File Prior to Visit  Medication Sig Dispense Refill  . co-enzyme Q-10 30 MG capsule Take 30 mg by mouth daily.    . Multiple Vitamin (MULTIVITAMIN) tablet Take 1 tablet by mouth daily.     No current facility-administered medications on file prior to visit.     No Known Allergies  Past Medical History:  Diagnosis Date  . Hemifacial spasm    left    Past Surgical History:  Procedure Laterality Date  . CESAREAN SECTION  1983, 1985  . RECTAL PROLAPSE REPAIR    . TONSILLECTOMY AND ADENOIDECTOMY    . WISDOM TOOTH EXTRACTION      Family History  Problem Relation Age of Onset  . Arthritis Mother   . Cancer Mother   . Cancer Father   . Cancer Brother   . Stroke Brother   . Cancer Maternal Grandmother     Social History   Social History  . Marital status: Married    Spouse name: N/A  . Number of children: N/A  . 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 Smoking status: Former Smoker    Quit date: 09/28/1990  . Smokeless tobacco: Never Used  . Alcohol use 0.0 oz/week     Comment: couple drinks a week (max)  . Drug use: No  . Sexual activity: No   Other Topics Concern  . Not on file   Social History Narrative  . No narrative on file   The  PMH, PSH, Social History, Family History, Medications, and allergies [facility], and have been updated if relevant.   Review of Systems  Respiratory: Negative.   Gastrointestinal: Negative.   Musculoskeletal: Negative.   Skin: Positive for rash.  Hematological: Negative.   Psychiatric/Behavioral: Negative.   All other systems reviewed and are negative.      Objective:    BP 120/72   Pulse 80   Temp 98.3 F (36.8 C) (Oral)   Wt 176 lb (79.8 kg)   BMI 31.18 kg/m    Physical Exam  Constitutional: She is oriented to person, place, and time. She appears well-developed and well-nourished. No distress.  HENT:  Head: Normocephalic.  Eyes: Conjunctivae are normal.  Cardiovascular: Normal rate.   Pulmonary/Chest: Effort normal. Right breast exhibits no inverted nipple, no mass, no nipple discharge, no skin change and no tenderness. Left breast exhibits skin change. Left breast exhibits no inverted nipple, no mass, no nipple discharge and no tenderness. Breasts are symmetrical.    Musculoskeletal: Normal range of motion.  Neurological: She is alert and oriented to person, place, and time. No cranial nerve deficit.  Skin: She is not diaphoretic.  Psychiatric: She has a normal mood and affect. Her behavior is normal. Judgment and thought content normal.          Assessment & Plan:   Skin lesion of breast - Plan: MM Digital Diagnostic Unilat L, US BREAST LTD UNI LEFT INC AXILLA, B. Burgdorfi Antibodies, Comprehensive metabolic panel, Sedimentation Rate No Follow-up on file.

## 2016-07-12 NOTE — Assessment & Plan Note (Signed)
New- progressive. Etiology unclear at this point. Has not had a screening mammogram in years. Diag mammo and US for further evaluation of breast. Lyme abs to rule out Lyme disease. May need derm referral.

## 2016-07-12 NOTE — Telephone Encounter (Signed)
Ok thanks.  Order changed.

## 2016-07-12 NOTE — Telephone Encounter (Signed)
Dr I spoke with jamie @ [facility].  The need mammogram order changed to [id number].  Bilateral diag mammogram

## 2016-07-12 NOTE — Patient Instructions (Signed)
Great to see you. After you go to the lab, please stop by to see Betty Smith on your way out.

## 2016-07-12 NOTE — Addendum Note (Signed)
Addended by: Baldomero LamyHAVERS, NATASHA C on: 07/12/2016 03:35 PM   Modules accepted: Orders

## 2016-07-12 NOTE — Progress Notes (Signed)
Pre visit review using our clinic review tool, if applicable. No additional management support is needed unless otherwise documented below in the visit note. 

## 2016-07-13 ENCOUNTER — Other Ambulatory Visit: Payer: Self-pay | Admitting: Family Medicine

## 2016-07-13 DIAGNOSIS — L989 Disorder of the skin and subcutaneous tissue, unspecified: Secondary | ICD-10-CM

## 2016-07-13 LAB — LYME AB/WESTERN BLOT REFLEX

## 2016-07-26 ENCOUNTER — Telehealth: Payer: Self-pay | Admitting: Neurology

## 2016-07-26 DIAGNOSIS — G5139 Clonic hemifacial spasm, unspecified: Secondary | ICD-10-CM

## 2016-07-26 DIAGNOSIS — R9089 Other abnormal findings on diagnostic imaging of central nervous system: Secondary | ICD-10-CM

## 2016-07-26 NOTE — Telephone Encounter (Signed)
Patient due for 6 month follow up MRI Brain. Order entered and faxed to [facility]. They will call patient to schedule.

## 2016-08-02 ENCOUNTER — Ambulatory Visit
Admission: RE | Admit: 2016-08-02 | Discharge: 2016-08-02 | Disposition: A | Discharge status: Home / Self Care | Payer: 59 | Source: Ambulatory Visit | Attending: Family Medicine | Admitting: Family Medicine

## 2016-08-02 DIAGNOSIS — L988 Other specified disorders of the skin and subcutaneous tissue: Secondary | ICD-10-CM

## 2016-08-02 DIAGNOSIS — N649 Disorder of breast, unspecified: Secondary | ICD-10-CM

## 2016-08-23 ENCOUNTER — Encounter: Payer: Self-pay | Admitting: Neurology

## 2016-08-31 ENCOUNTER — Telehealth: Payer: Self-pay | Admitting: Neurology

## 2016-08-31 NOTE — Telephone Encounter (Signed)
Patient made aware of results. She states only had trouble a couple times in the last year with spasm. She wants to wait on referral and will call back if needed.

## 2016-08-31 NOTE — Telephone Encounter (Signed)
Left message on machine for patient to call back.

## 2016-08-31 NOTE — Telephone Encounter (Signed)
Patient had a repeat MRI of the brain dated 08/23/2016 at [facility].  I reviewed those images.  It was stable when compared to 10/01/2015.  There is a dominant, tortuous left vertebral artery, exerting mass effect on the left facial and left acoustic nerves and also left trigeminal nerve.  Generally, you can with the patient noted that her MRI has not changed, but I would still recommend consultation with Dr. Conchita ParisNundkumar.  I know she did not want to proceed with this last time I talked with her.  This tortuous vertebral artery is likely the cause of her facial spasm.

## 2016-10-19 ENCOUNTER — Encounter: Payer: Self-pay | Admitting: Neurology

## 2016-12-09 ENCOUNTER — Encounter: Payer: Self-pay | Admitting: Family Medicine

## 2016-12-09 ENCOUNTER — Other Ambulatory Visit (HOSPITAL_COMMUNITY)
Admission: RE | Admit: 2016-12-09 | Discharge: 2016-12-09 | Disposition: A | Discharge status: Home / Self Care | Payer: 59 | Source: Ambulatory Visit | Attending: Family Medicine | Admitting: Family Medicine

## 2016-12-09 ENCOUNTER — Ambulatory Visit (INDEPENDENT_AMBULATORY_CARE_PROVIDER_SITE_OTHER): Payer: 59 | Admitting: Family Medicine

## 2016-12-09 VITALS — BP 138/82 | HR 75 | Temp 98.2°F | Resp 20 | Ht 63.0 in | Wt 192.2 lb

## 2016-12-09 DIAGNOSIS — Z01419 Encounter for gynecological examination (general) (routine) without abnormal findings: Secondary | ICD-10-CM

## 2016-12-09 DIAGNOSIS — Z124 Encounter for screening for malignant neoplasm of cervix: Secondary | ICD-10-CM | POA: Diagnosis not present

## 2016-12-09 LAB — CBC WITH DIFFERENTIAL/PLATELET
BASOS ABS: 0.1 10*3/uL (ref 0.0–0.1)
Basophils Relative: 0.9 % (ref 0.0–3.0)
EOS ABS: 0.3 10*3/uL (ref 0.0–0.7)
Eosinophils Relative: 5.6 % — ABNORMAL HIGH (ref 0.0–5.0)
HCT: 40.4 % (ref 36.0–46.0)
Hemoglobin: 13.8 g/dL (ref 12.0–15.0)
LYMPHS ABS: 1.2 10*3/uL (ref 0.7–4.0)
Lymphocytes Relative: 20.3 % (ref 12.0–46.0)
MCHC: 34 g/dL (ref 30.0–36.0)
MCV: 86.5 fl (ref 78.0–100.0)
MONO ABS: 0.5 10*3/uL (ref 0.1–1.0)
Monocytes Relative: 9 % (ref 3.0–12.0)
NEUTROS PCT: 64.2 % (ref 43.0–77.0)
Neutro Abs: 3.9 10*3/uL (ref 1.4–7.7)
Platelets: 227 10*3/uL (ref 150.0–400.0)
RBC: 4.67 Mil/uL (ref 3.87–5.11)
RDW: 13.7 % (ref 11.5–15.5)
WBC: 6.1 10*3/uL (ref 4.0–10.5)

## 2016-12-09 LAB — COMPREHENSIVE METABOLIC PANEL
ALK PHOS: 76 U/L (ref 39–117)
ALT: 17 U/L (ref 0–35)
AST: 16 U/L (ref 0–37)
Albumin: 4.2 g/dL (ref 3.5–5.2)
BUN: 14 mg/dL (ref 6–23)
CO2: 30 mEq/L (ref 19–32)
Calcium: 9.3 mg/dL (ref 8.4–10.5)
Chloride: 104 mEq/L (ref 96–112)
Creatinine, Ser: 0.6 mg/dL (ref 0.40–1.20)
GFR: 108.74 mL/min (ref >60.00)
GLUCOSE: 102 mg/dL — AB (ref 70–99)
POTASSIUM: 4.3 meq/L (ref 3.5–5.1)
SODIUM: 138 meq/L (ref 135–145)
TOTAL PROTEIN: 6.7 g/dL (ref 6.0–8.3)
Total Bilirubin: 0.6 mg/dL (ref 0.2–1.2)

## 2016-12-09 LAB — LIPID PANEL
Cholesterol: 223 mg/dL — ABNORMAL HIGH (ref 0–200)
HDL: 54.4 mg/dL (ref >39.00)
LDL Cholesterol: 151 mg/dL — ABNORMAL HIGH (ref 0–99)
NONHDL: 168.74
Total CHOL/HDL Ratio: 4
Triglycerides: 89 mg/dL (ref 0.0–149.0)
VLDL: 17.8 mg/dL (ref 0.0–40.0)

## 2016-12-09 LAB — TSH: TSH: 1.46 u[IU]/mL (ref 0.35–4.50)

## 2016-12-09 NOTE — Addendum Note (Signed)
Addended by: Alvina ChouWALSH, TERRI J on: 12/09/2016 09:39 AM   Modules accepted: Orders

## 2016-12-09 NOTE — Assessment & Plan Note (Signed)
Reviewed preventive care protocols, scheduled due services, and updated immunizations Discussed nutrition, exercise, diet, and healthy lifestyle.  Refusing mammogram. Breast exam reassuring. Declines colonoscopy, agrees to yearly IFOB which was ordered today.  Orders Placed This Encounter  Procedures  . Fecal occult blood, imunochemical  . CBC with Differential/Platelet  . Comprehensive metabolic panel  . Lipid panel  . TSH

## 2016-12-09 NOTE — Patient Instructions (Signed)
Great to see you. We will call you with your results from today and you can view them online. 

## 2016-12-09 NOTE — Addendum Note (Signed)
Addended by: Thomasena EdisWILLIAMS, George Alcantar N on: 12/09/2016 09:01 AM   Modules accepted: Orders

## 2016-12-09 NOTE — Progress Notes (Signed)
Subjective:   Patient ID: Betty Smith, female    DOB: 1956/02/25, 61 y.o.   MRN: [id number]  Betty Smith is a pleasant 61 y.o. year old female who presents to clinic today with Annual Exam  on 12/09/2016  HPI:  G12P12  LMP over 5 years ago, no h/o postmenopausal bleeding.  No h/o abnormal pap smears.  Last pap smear done by me on 04/10/14.   She is refusing screening mammogram.  Only family h/o CA is breast CA in maternal grand mother who was in her 4870s at the time.  Has not had a colonoscopy since she turned 52. IFOB negative 10/01/15, willing to continue yearly stool cards. Denies any changes in her bowel habits or blood in her stool.  Lab Results  Component Value Date   CHOL 195 06/02/2015   HDL 52.60 06/02/2015   LDLCALC 129 (H) 06/02/2015   TRIG 67.0 06/02/2015   CHOLHDL 4 06/02/2015   Lab Results  Component Value Date   NA 143 07/12/2016   K 4.0 07/12/2016   CL 104 07/12/2016   CO2 33 (H) 07/12/2016   Lab Results  Component Value Date   ALT 18 07/12/2016   AST 20 07/12/2016   ALKPHOS 82 07/12/2016   BILITOT 0.4 07/12/2016   Lab Results  Component Value Date   WBC 5.1 06/02/2015   HGB 13.9 06/02/2015   HCT 41.0 06/02/2015   MCV 86.8 06/02/2015   PLT 221.0 06/02/2015   Lab Results  Component Value Date   TSH 2.30 06/02/2015     Current Outpatient Prescriptions on File Prior to Visit  Medication Sig Dispense Refill  . co-enzyme Q-10 30 MG capsule Take 30 mg by mouth daily.    . Multiple Vitamin (MULTIVITAMIN) tablet Take 1 tablet by mouth daily.    . Probiotic Product (PROBIOTIC DAILY PO) Take by mouth daily.     No current facility-administered medications on file prior to visit.     No Known Allergies  Past Medical History:  Diagnosis Date  . Hemifacial spasm    left    Past Surgical History:  Procedure Laterality Date  . CESAREAN SECTION  1983, 1985  . RECTAL PROLAPSE REPAIR    . TONSILLECTOMY AND ADENOIDECTOMY    . WISDOM  TOOTH EXTRACTION      Family History  Problem Relation Age of Onset  . Arthritis Mother   . Cancer Mother   . Cancer Father   . Cancer Brother   . Stroke Brother   . Cancer Maternal Grandmother   . Breast cancer Maternal Grandmother     Social History   Social History  . Marital status: Married    Spouse name: N/A  . Number of children: N/A  . 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 Smoking status: Former Smoker    Quit date: 09/28/1990  . Smokeless tobacco: Never Used  . Alcohol use 0.0 oz/week     Comment: couple drinks a week (max)  . Drug use: No  . Sexual activity: No   Other Topics Concern  . Not on file   Social History Narrative  . No narrative on file   The PMH, PSH, Social History, Family History, Medications, and allergies have been reviewed in [facility], and have been updated if relevant.   Review of Systems  Constitutional: Negative.   HENT: Negative.  Eyes: Negative.   Respiratory: Negative.   Cardiovascular: Negative.   Gastrointestinal: Negative.   Endocrine: Negative.   Genitourinary: Negative.   Musculoskeletal: Negative.   Skin: Negative.   Allergic/Immunologic: Negative.   Neurological: Negative.   Hematological: Negative.   Psychiatric/Behavioral: Negative.   All other systems reviewed and are negative.      Objective:    BP 138/82 (BP Location: Left Arm, Patient Position: Sitting, Cuff Size: Large)   Pulse 75   Temp 98.2 F (36.8 C)   Resp 20   Ht 5\' 3"  (1.6 m)   Wt 192 lb 4 oz (87.2 kg)   SpO2 97%   BMI 34.06 kg/m    Physical Exam   General:  Well-developed,well-nourished,in no acute distress; alert,appropriate and cooperative throughout examination Head:  normocephalic and atraumatic.   Eyes:  vision grossly intact, PERRL Ears:  R ear normal and L ear normal externally, TMs clear bilaterally Nose:  no external deformity.   Mouth:   good dentition.   Neck:  No deformities, masses, or tenderness noted. Breasts:  No mass, nodules, thickening, tenderness, bulging, retraction, inflamation, nipple discharge or skin changes noted.   Lungs:  Normal respiratory effort, chest expands symmetrically. Lungs are clear to auscultation, no crackles or wheezes. Heart:  Normal rate and regular rhythm. S1 and S2 normal without gallop, murmur, click, rub or other extra sounds. Abdomen:  Bowel sounds positive,abdomen soft and non-tender without masses, organomegaly or hernias noted. Rectal:  no external abnormalities.   Genitalia:  Pelvic Exam:        External: normal female genitalia without lesions or masses        Vagina: normal without lesions or masses        Cervix: normal without lesions or masses        Adnexa: normal bimanual exam without masses or fullness        Uterus: normal by palpation        Pap smear: performed Msk:  No deformity or scoliosis noted of thoracic or lumbar spine.   Extremities:  No clubbing, cyanosis, edema, or deformity noted with normal full range of motion of all joints.   Neurologic:  alert & oriented X3 and gait normal.   Skin:  Intact without suspicious lesions or rashes Cervical Nodes:  No lymphadenopathy noted Axillary Nodes:  No palpable lymphadenopathy Psych:  Cognition and judgment appear intact. Alert and cooperative with normal attention span and concentration. No apparent delusions, illusions, hallucinations       Assessment & Plan:   Well woman exam - Plan: CBC with Differential/Platelet, Comprehensive metabolic panel, Lipid panel, TSH, Fecal occult blood, imunochemical No Follow-up on file.

## 2016-12-10 ENCOUNTER — Encounter: Payer: Self-pay | Admitting: Family Medicine

## 2016-12-10 LAB — CYTOLOGY - PAP: Diagnosis: NEGATIVE

## 2017-04-18 ENCOUNTER — Encounter: Payer: Self-pay | Admitting: Family Medicine

## 2017-04-18 ENCOUNTER — Ambulatory Visit (INDEPENDENT_AMBULATORY_CARE_PROVIDER_SITE_OTHER)
Admission: RE | Admit: 2017-04-18 | Discharge: 2017-04-18 | Disposition: A | Discharge status: Home / Self Care | Payer: 59 | Source: Ambulatory Visit | Attending: Family Medicine | Admitting: Family Medicine

## 2017-04-18 ENCOUNTER — Ambulatory Visit (INDEPENDENT_AMBULATORY_CARE_PROVIDER_SITE_OTHER): Payer: 59 | Admitting: Family Medicine

## 2017-04-18 VITALS — BP 130/84 | HR 66 | Temp 98.5°F | Ht 63.0 in | Wt 183.0 lb

## 2017-04-18 DIAGNOSIS — M25562 Pain in left knee: Secondary | ICD-10-CM | POA: Diagnosis not present

## 2017-04-18 DIAGNOSIS — M2392 Unspecified internal derangement of left knee: Secondary | ICD-10-CM

## 2017-04-18 NOTE — Progress Notes (Signed)
Dr. Karleen Hampshire T. Aliyyah Riese, MD, CAQ Sports [facility] [address] [place], [address] Phone: [phone] Fax: [phone]  04/18/2017  Patient: Betty Smith, MRN: [id number], DOB: 15-May-1956, 61 y.o.  Primary Physician:  Dianne Dun, MD   Chief Complaint  Patient presents with  . Knee Pain    Left x 3 to 4 weeks   Subjective:   Betty Smith is a 61 y.o. very pleasant female patient who presents with the following:  Left knee: 4 weeks, anteromedial. Pain starts to go around and burning in the front. Bending will hurt. ? Went down on knee a long time ago.   She has pain on the medial joint line. There is also some anteromedial pain. There is also some fullness on the medial aspect of the knee. She has not had any locking up or any mechanical buckling. She has not had an effusion. She does have some pain with twisting, and some pain with getting up after resting for some period of time.  Past Medical History, Surgical History, Social History, Family History, Problem List, Medications, and Allergies have been reviewed and updated if relevant.  Patient Active Problem List   Diagnosis Date Noted  . Well woman exam 06/05/2015    Past Medical History:  Diagnosis Date  . Hemifacial spasm    left    Past Surgical History:  Procedure Laterality Date  . CESAREAN SECTION  1983, 1985  . RECTAL PROLAPSE REPAIR    . TONSILLECTOMY AND ADENOIDECTOMY    . WISDOM TOOTH EXTRACTION      Social History   Social History  . Marital status: Married    Spouse name: N/A  . Number of children: N/A  . 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 Smoking status: Former Smoker    Quit date: 09/28/1990  . Smokeless tobacco: Never Used  . Alcohol use 0.0 oz/week     Comment: couple drinks a week (max)  . Drug use: No  . Sexual activity: No   Other Topics Concern  . Not  on file   Social History Narrative  . No narrative on file    Family History  Problem Relation Age of Onset  . Arthritis Mother   . Cancer Mother   . Cancer Father   . Cancer Brother   . Stroke Brother   . Cancer Maternal Grandmother   . Breast cancer Maternal Grandmother     No Known Allergies  Medication list reviewed and updated in full in [facility].  GEN: No fevers, chills. Nontoxic. Primarily MSK c/o today. MSK: Detailed in the HPI GI: tolerating PO intake without difficulty Neuro: No numbness, parasthesias, or tingling associated. Otherwise the pertinent positives of the ROS are noted above.   Objective:   BP 130/84   Pulse 66   Temp 98.5 F (36.9 C) (Oral)   Ht 5\' 3"  (1.6 m)   Wt 183 lb (83 kg)   BMI 32.42 kg/m    GEN: WDWN, NAD, Non-toxic, Alert & Oriented x 3 HEENT: Atraumatic, Normocephalic.  Ears and Nose: No external deformity. EXTR: No clubbing/cyanosis/edema NEURO: Normal gait.  PSYCH: Normally interactive. Conversant. Not depressed or anxious appearing.  Calm demeanor.   Knee:  L Gait: Normal heel toe pattern ROM: 0-135 Effusion: neg Echymosis or edema: none Patellar tendon NT Painful PLICA: neg  Patellar grind: negative Medial and lateral patellar facet loading: negative medial and lateral joint lines: medial joint line pain Mcmurray's neg Flexion-pinch neg Varus and valgus stress: stable Lachman: neg Ant and Post drawer: neg Hip abduction, IR, ER: WNL Hip flexion str: 5/5 Hip abd: 5/5 Quad: 5/5 VMO atrophy:No Hamstring concentric and eccentric: 5/5   Radiology: Dg Knee 4 Views W/patella Left  Result Date: 04/18/2017 CLINICAL DATA:  Four weeks of left knee pain EXAM: LEFT KNEE - COMPLETE 4+ VIEW COMPARISON:  None. FINDINGS: No evidence of fracture, dislocation, or joint effusion. No evidence of arthropathy or other focal bone abnormality. Soft tissues are unremarkable. IMPRESSION: Negative. Electronically Signed   By: Charlett NoseKevin   Dover M.D.   On: 04/18/2017 12:03     Assessment and Plan:   Acute internal derangement of left knee  Acute pain of left knee - Plan: DG Knee 4 Views W/Patella Left  >25 minutes spent in face to face time with patient, >50% spent in counselling or coordination of care   Zoila ShutterRosenberg view, there is some mild medial compartmental osteoarthritis.  Exam is most suspicious for a medial meniscal tear, though not that symptomatic. Recommended conservative treatment for now, quadricep strengthening, continued activity, rest, ice, NSAIDs as needed and time.  Follow-up in 2 months if still symptomatic  Follow-up: No Follow-up on file.  Orders Placed This Encounter  Procedures  . DG Knee 4 Views W/Patella Left    Signed,  Herby Amick T. Maneh Sieben, MD   Allergies as of 04/18/2017   No Known Allergies     Medication List       Accurate as of 04/18/17  2:10 PM. Always use your most recent med list.          co-enzyme Q-10 30 MG capsule Take 30 mg by mouth daily.   multivitamin tablet Take 1 tablet by mouth daily.   PROBIOTIC DAILY PO Take by mouth daily.

## 2017-04-19 ENCOUNTER — Encounter: Payer: Self-pay | Admitting: Family Medicine

## 2018-04-17 ENCOUNTER — Ambulatory Visit: Payer: Self-pay

## 2018-04-17 ENCOUNTER — Ambulatory Visit (INDEPENDENT_AMBULATORY_CARE_PROVIDER_SITE_OTHER): Payer: 59 | Admitting: Family Medicine

## 2018-04-17 ENCOUNTER — Encounter: Payer: Self-pay | Admitting: Family Medicine

## 2018-04-17 VITALS — BP 142/78 | HR 71 | Temp 98.6°F | Ht 63.0 in | Wt 171.2 lb

## 2018-04-17 DIAGNOSIS — S20211A Contusion of right front wall of thorax, initial encounter: Secondary | ICD-10-CM

## 2018-04-17 NOTE — Telephone Encounter (Signed)
Noted  

## 2018-04-17 NOTE — Patient Instructions (Signed)
Can take ibuprofen 2 tablets every 8 to 12 hours for pain as needed  Can try heat and ice- use which ever one feels better   Rib Contusion A rib contusion is a deep bruise on your rib area. Contusions are the result of a blunt trauma that causes bleeding and injury to the tissues under the skin. A rib contusion may involve bruising of the ribs and of the skin and muscles in the area. The skin overlying the contusion may turn blue, purple, or yellow. Minor injuries will give you a painless contusion, but more severe contusions may stay painful and swollen for a few weeks. What are the causes? A contusion is usually caused by a blow, trauma, or direct force to an area of the body. This often occurs while playing contact sports. What are the signs or symptoms?  Swelling and redness of the injured area.  Discoloration of the injured area.  Tenderness and soreness of the injured area.  Pain with or without movement. How is this diagnosed? The diagnosis can be made by taking a medical history and performing a physical exam. An X-ray, CT scan, or MRI may be needed to determine if there were any associated injuries, such as broken bones (fractures) or internal injuries. How is this treated? Often, the best treatment for a rib contusion is rest. Icing or applying cold compresses to the injured area may help reduce swelling and inflammation. Deep breathing exercises may be recommended to reduce the risk of partial lung collapse and pneumonia. Over-the-counter or prescription medicines may also be recommended for pain control. Follow these instructions at home:  Apply ice to the injured area: ? Put ice in a plastic bag. ? Place a towel between your skin and the bag. ? Leave the ice on for 20 minutes, 2-3 times per day.  Take medicines only as directed by your health care provider.  Rest the injured area. Avoid strenuous activity and any activities or movements that cause pain. Be careful during  activities and avoid bumping the injured area.  Perform deep-breathing exercises as directed by your health care provider.  Do not lift anything that is heavier than 5 lb (2.3 kg) until your health care provider approves.  Do not use any tobacco products, including cigarettes, chewing tobacco, or electronic cigarettes. If you need help quitting, ask your health care provider. Contact a health care provider if:  You have increased bruising or swelling.  You have pain that is not controlled with treatment.  You have a fever. Get help right away if:  You have difficulty breathing or shortness of breath.  You develop a continual cough, or you cough up thick or bloody sputum.  You feel sick to your stomach (nauseous), you throw up (vomit), or you have abdominal pain. This information is not intended to replace advice given to you by your health care provider. Make sure you discuss any questions you have with your health care provider. Document Released: 06/22/2001 Document Revised: 03/04/2016 Document Reviewed: 07/09/2014 Elsevier Interactive Patient Education  [employer]2018 [employer]

## 2018-04-17 NOTE — Telephone Encounter (Signed)
Pt. Reports Saturday she was sitting in a chair and reached for something - has pain under her right arm, side of chest, next to the breast. Hurts with movement. No other symptoms. States "I feel like I  broke a rib." Appointment made for today.  Reason for Disposition . [1] Chest pain lasting <= 5 minutes AND [2] NO chest pain or cardiac symptoms now(Exceptions: pains lasting a few seconds)  Answer Assessment - Initial Assessment Questions 1. LOCATION: "Where does it hurt?"       Right side , under the arm 2. RADIATION: "Does the pain go anywhere else?" (e.g., into neck, jaw, arms, back)     No 3. ONSET: "When did the chest pain begin?" (Minutes, hours or days)      The weekend 4. PATTERN "Does the pain come and go, or has it been constant since it started?"  "Does it get worse with exertion?"      Saturday - comes and goes 5. DURATION: "How long does it last" (e.g., seconds, minutes, hours)     Seconds 6. SEVERITY: "How bad is the pain?"  (e.g., Scale 1-10; mild, moderate, or severe)    - MILD (1-3): doesn't interfere with normal activities     - MODERATE (4-7): interferes with normal activities or awakens from sleep    - SEVERE (8-10): excruciating pain, unable to do any normal activities       6 7. CARDIAC RISK FACTORS: "Do you have any history of heart problems or risk factors for heart disease?" (e.g., prior heart attack, angina; high blood pressure, diabetes, being overweight, high cholesterol, smoking, or strong family history of heart disease)     No 8. PULMONARY RISK FACTORS: "Do you have any history of lung disease?"  (e.g., blood clots in lung, asthma, emphysema, birth control pills)     No 9. CAUSE: "What do you think is causing the chest pain?"     Pulled a muscle possibly - hurts with movement 10. OTHER SYMPTOMS: "Do you have any other symptoms?" (e.g., dizziness, nausea, vomiting, sweating, fever, difficulty breathing, cough)       No 11. PREGNANCY: "Is there any chance  you are pregnant?" "When was your last menstrual period?"       No  Protocols used: CHEST PAIN-A-AH

## 2018-04-17 NOTE — Progress Notes (Signed)
   Subjective:    Patient ID: Betty Smith, female    DOB: 06-24-1956, 62 y.o.   MRN: [id number]  HPI This is a 62 yo female who presents today with pain on right side x 3 days. Pain started with her leaning over her recliner arm to grab for her grandchild. Has not taken anything for pain. Heat from shower provides temporary improvement of pain. She had broken rib before and this feels similar. Prefers not to take medication. No discoloration noticed. No SOB, wheeze or restriction of movement of UE. 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   Past Medical History:  Diagnosis Date  . Hemifacial spasm    left   Past Surgical History:  Procedure Laterality Date  . CESAREAN SECTION  1983, 1985  . RECTAL PROLAPSE REPAIR    . TONSILLECTOMY AND ADENOIDECTOMY    . WISDOM TOOTH EXTRACTION     Family History  Problem Relation Age of Onset  . Arthritis Mother   . Cancer Mother   . Cancer Father   . Cancer Brother   . Stroke Brother   . Cancer Maternal Grandmother   . Breast cancer Maternal Grandmother    Social History   Tobacco Use  . Smoking status: Former Smoker    Last attempt to quit: 09/28/1990    Years since quitting: 27.5  . Smokeless tobacco: Never Used  Substance Use Topics  . Alcohol use: Yes    Alcohol/week: 0.0 oz    Comment: couple drinks a week (max)  . Drug use: No      Review of Systems Per HPI    Objective:   Physical Exam  Constitutional: She is oriented to person, place, and time. She appears well-developed and well-nourished. No distress.  HENT:  Head: Normocephalic and atraumatic.  Cardiovascular: Normal rate.  Pulmonary/Chest: Effort normal.  Musculoskeletal: Normal range of motion.  Right lateral rib tenderness. No erythema, rash or bruising. UE with full ROM.   Neurological: She is alert and oriented to person, place, and time.  Skin: Skin is warm and dry. No rash noted.  She is not diaphoretic. No erythema.  Psychiatric: She has a normal mood and affect. Her behavior is normal. Judgment and thought content normal.  Vitals reviewed.     BP (!) 142/78 (BP Location: Right Arm, Patient Position: Sitting, Cuff Size: Normal)   Pulse 71   Temp 98.6 F (37 C) (Oral)   Ht 5\' 3"  (1.6 m)   Wt 171 lb 4 oz (77.7 kg)   SpO2 97%   BMI 30.34 kg/m      Assessment & Plan:  1. Contusion of rib on right side, initial encounter - Provided written and verbal information regarding diagnosis and treatment. - Discussed symptomatic treatment including low dose NSAIDs, heat, ice prn - RTC precautions reviewed   Olean Reeeborah Lestine Rahe, FNP-BC  [facility], [facility]  04/17/2018 10:34 AM

## 2018-07-27 DIAGNOSIS — R51 Headache: Secondary | ICD-10-CM | POA: Diagnosis not present

## 2018-07-27 DIAGNOSIS — M9901 Segmental and somatic dysfunction of cervical region: Secondary | ICD-10-CM | POA: Diagnosis not present

## 2018-07-27 DIAGNOSIS — M9902 Segmental and somatic dysfunction of thoracic region: Secondary | ICD-10-CM | POA: Diagnosis not present

## 2018-07-28 DIAGNOSIS — R51 Headache: Secondary | ICD-10-CM | POA: Diagnosis not present

## 2018-07-28 DIAGNOSIS — M9902 Segmental and somatic dysfunction of thoracic region: Secondary | ICD-10-CM | POA: Diagnosis not present

## 2018-07-28 DIAGNOSIS — M9901 Segmental and somatic dysfunction of cervical region: Secondary | ICD-10-CM | POA: Diagnosis not present

## 2018-07-31 DIAGNOSIS — M9901 Segmental and somatic dysfunction of cervical region: Secondary | ICD-10-CM | POA: Diagnosis not present

## 2018-07-31 DIAGNOSIS — M9902 Segmental and somatic dysfunction of thoracic region: Secondary | ICD-10-CM | POA: Diagnosis not present

## 2018-07-31 DIAGNOSIS — R51 Headache: Secondary | ICD-10-CM | POA: Diagnosis not present

## 2018-08-02 DIAGNOSIS — M9902 Segmental and somatic dysfunction of thoracic region: Secondary | ICD-10-CM | POA: Diagnosis not present

## 2018-08-02 DIAGNOSIS — M9901 Segmental and somatic dysfunction of cervical region: Secondary | ICD-10-CM | POA: Diagnosis not present

## 2018-08-02 DIAGNOSIS — R51 Headache: Secondary | ICD-10-CM | POA: Diagnosis not present

## 2018-08-04 DIAGNOSIS — M9901 Segmental and somatic dysfunction of cervical region: Secondary | ICD-10-CM | POA: Diagnosis not present

## 2018-08-04 DIAGNOSIS — R51 Headache: Secondary | ICD-10-CM | POA: Diagnosis not present

## 2018-08-04 DIAGNOSIS — M9902 Segmental and somatic dysfunction of thoracic region: Secondary | ICD-10-CM | POA: Diagnosis not present

## 2019-09-25 ENCOUNTER — Other Ambulatory Visit: Payer: Self-pay

## 2019-09-25 DIAGNOSIS — E785 Hyperlipidemia, unspecified: Secondary | ICD-10-CM | POA: Insufficient documentation

## 2019-09-25 NOTE — Progress Notes (Signed)
Subjective:    Patient ID: Betty Smith, female    DOB: [birth year], 63 y.o.   MRN: [id number]  Chief Complaint  Patient presents with  . Annual Exam  . Elbow Pain    HPI Patient is in today for CPX.  Health Maintenance  Topic Date Due  . Hepatitis C Screening  1956-09-06  . Fecal DNA (Cologuard)  11/30/2007  . MAMMOGRAM  08/02/2018  . INFLUENZA VACCINE  01/09/2020 (Originally 05/12/2019)  . TETANUS/TDAP  09/25/2020 (Originally 11/29/1976)  . HIV Screening  09/25/2020 (Originally 11/29/1972)  . PAP SMEAR-Modifier  12/10/2019   G12P12  LMP over 5]7 years ago, no h/o postmenopausal bleeding.  No h/o abnormal pap smears.  Last pap smear done by me on 12/09/16   She is refusing screening mammogram.  Only family h/o CA is breast CA in maternal grand mother who was in her 50s at the time.  Has not had a colonoscopy since she turned 52. IFOB negative 10/01/15, willing to continue yearly stool cards.  Denies any changes in her bowel habits or blood in her stool.  HLD-  Lab Results  Component Value Date   CHOL 223 (H) 12/09/2016   HDL 54.40 12/09/2016   LDLCALC 151 (H) 12/09/2016   TRIG 89.0 12/09/2016   CHOLHDL 4 12/09/2016   The 10-year ASCVD risk score Denman George DC Jr., et al., 2013) is: 3.5%   Values used to calculate the score:     Age: 28 years     Sex: Female     Is Non-Hispanic African American: No     Diabetic: No     Tobacco smoker: No     Systolic Blood Pressure: 120 mmHg     Is BP treated: No     HDL Cholesterol: 54.4 mg/dL     Total Cholesterol: 223 mg/dL  Depression screen [facility] 2/9 09/26/2019 12/09/2016 12/09/2016  Decreased Interest 0 0 0  Down, Depressed, Hopeless 0 0 0  PHQ - 2 Score 0 0 0   No flowsheet data found.  Left elbow pain- intermittent for months, only with movement. Tries  Not to take any pain medication.  Never feels it at rest.  Past Medical History:  Diagnosis Date  . Hemifacial spasm    left    Past Surgical History:    Procedure Laterality Date  . CESAREAN SECTION  1983, 1985  . RECTAL PROLAPSE REPAIR    . TONSILLECTOMY AND ADENOIDECTOMY    . WISDOM TOOTH EXTRACTION      Family History  Problem Relation Age of Onset  . Arthritis Mother   . Cancer Mother   . Cancer Father   . Cancer Brother   . Stroke Brother   . Cancer Maternal Grandmother   . Breast cancer Maternal Grandmother     Social History   Socioeconomic History  . Marital status: Married    Spouse name: Not on file  . Number of children: Not on file  . Years of education: Not on file  . Highest education level: Not on file  Occupational History  . 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 Smoking status: Former Smoker    Quit date: 09/28/1990    Years since quitting: 29.0  . Smokeless tobacco: Never Used  Substance and Sexual Activity  . Alcohol use: Yes    Alcohol/week: 0.0 standard drinks    Comment: couple drinks a week (max)  . Drug use: No  .  Sexual activity: Never  Other Topics Concern  . Not on file  Social History Narrative  . Not on file   Social Determinants of Health   Financial Resource Strain:   . Difficulty of Paying Living Expenses: Not on file  Food Insecurity:   . Worried About Charity fundraiser in the Last Year: Not on file  . Ran Out of Food in the Last Year: Not on file  Transportation Needs:   . Lack of Transportation (Medical): Not on file  . Lack of Transportation (Non-Medical): Not on file  Physical Activity:   . Days of Exercise per Week: Not on file  . Minutes of Exercise per Session: Not on file  Stress:   . Feeling of Stress : Not on file  Social Connections:   . Frequency of Communication with Friends and Family: Not on file  . Frequency of Social Gatherings with Friends and Family: Not on file  . Attends Religious Services: Not on file  . Active Member of Clubs or Organizations: Not on file  . Attends Archivist Meetings: Not on  file  . Marital Status: Not on file  Intimate Partner Violence:   . Fear of Current or Ex-Partner: Not on file  . Emotionally Abused: Not on file  . Physically Abused: Not on file  . Sexually Abused: Not on file    Outpatient Medications Prior to Visit  Medication Sig Dispense Refill  . calcium-vitamin D (OSCAL WITH D) 500-200 MG-UNIT tablet Take 1 tablet by mouth.    Kendall Flack 575 MG/5ML SYRP Take by mouth.    . Ginseng 100 MG CAPS Take by mouth.    . Multiple Vitamin (MULTIVITAMIN) tablet Take 1 tablet by mouth daily.    . Omega-3 1000 MG CAPS Take by mouth.    . Turmeric (QC TUMERIC COMPLEX) 500 MG CAPS Take by mouth.    . co-enzyme Q-10 30 MG capsule Take 30 mg by mouth daily.    . Probiotic Product (PROBIOTIC DAILY PO) Take by mouth daily.     No facility-administered medications prior to visit.    No Known Allergies  Review of Systems  Constitutional: Negative.  Negative for fever and malaise/fatigue.  HENT: Negative.  Negative for congestion and hearing loss.   Eyes: Negative.  Negative for blurred vision, discharge and redness.  Respiratory: Negative.  Negative for cough and shortness of breath.   Cardiovascular: Negative.  Negative for chest pain, palpitations and leg swelling.  Gastrointestinal: Negative.  Negative for abdominal pain and heartburn.  Genitourinary: Negative.  Negative for dysuria.  Musculoskeletal: Positive for joint pain. Negative for falls.  Skin: Negative.  Negative for rash.  Neurological: Negative.  Negative for loss of consciousness and headaches.  Endo/Heme/Allergies: Negative.  Does not bruise/bleed easily.  Psychiatric/Behavioral: Negative.  Negative for depression.  All other systems reviewed and are negative.      Objective:    Physical Exam Vitals and nursing note reviewed.  Constitutional:      Appearance: Normal appearance. She is not ill-appearing.  HENT:     Head: Normocephalic and atraumatic.     Right Ear: External ear  normal.     Left Ear: External ear normal.     Nose: Nose normal.     Mouth/Throat:     Mouth: Mucous membranes are moist.  Eyes:     General:        Right eye: No discharge.        Left  eye: No discharge.     Extraocular Movements: Extraocular movements intact.     Pupils: Pupils are equal, round, and reactive to light.  Cardiovascular:     Rate and Rhythm: Normal rate and regular rhythm.     Heart sounds: Normal heart sounds.  Pulmonary:     Effort: Pulmonary effort is normal.     Breath sounds: Normal breath sounds. No wheezing.  Abdominal:     Palpations: Abdomen is soft. There is no mass.     Tenderness: There is no abdominal tenderness. There is no guarding.  Musculoskeletal:        General: Normal range of motion.     Right lower leg: No edema.     Left lower leg: No edema.  Skin:    General: Skin is warm and dry.  Neurological:     Mental Status: She is alert and oriented to person, place, and time.     Deep Tendon Reflexes: Reflexes normal.  Psychiatric:        Mood and Affect: Mood normal.        Behavior: Behavior normal.     BP 120/90 (BP Location: Left Arm, Patient Position: Sitting, Cuff Size: Normal)   Pulse 73   Temp (!) 96.1 F (35.6 C) (Temporal)   Ht 5' 3.25" (1.607 m)   Wt 174 lb 6.4 oz (79.1 kg)   SpO2 97%   BMI 30.65 kg/m  Wt Readings from Last 3 Encounters:  09/26/19 174 lb 6.4 oz (79.1 kg)  04/17/18 171 lb 4 oz (77.7 kg)  04/18/17 183 lb (83 kg)     Lab Results  Component Value Date   WBC 6.1 12/09/2016   HGB 13.8 12/09/2016   HCT 40.4 12/09/2016   PLT 227.0 12/09/2016   GLUCOSE 102 (H) 12/09/2016   CHOL 223 (H) 12/09/2016   TRIG 89.0 12/09/2016   HDL 54.40 12/09/2016   LDLCALC 151 (H) 12/09/2016   ALT 17 12/09/2016   AST 16 12/09/2016   NA 138 12/09/2016   K 4.3 12/09/2016   CL 104 12/09/2016   CREATININE 0.60 12/09/2016   BUN 14 12/09/2016   CO2 30 12/09/2016   TSH 1.46 12/09/2016    Lab Results  Component Value Date     TSH 1.46 12/09/2016   Lab Results  Component Value Date   WBC 6.1 12/09/2016   HGB 13.8 12/09/2016   HCT 40.4 12/09/2016   MCV 86.5 12/09/2016   PLT 227.0 12/09/2016   Lab Results  Component Value Date   NA 138 12/09/2016   K 4.3 12/09/2016   CO2 30 12/09/2016   GLUCOSE 102 (H) 12/09/2016   BUN 14 12/09/2016   CREATININE 0.60 12/09/2016   BILITOT 0.6 12/09/2016   ALKPHOS 76 12/09/2016   AST 16 12/09/2016   ALT 17 12/09/2016   PROT 6.7 12/09/2016   ALBUMIN 4.2 12/09/2016   CALCIUM 9.3 12/09/2016   GFR 108.74 12/09/2016   Lab Results  Component Value Date   CHOL 223 (H) 12/09/2016   Lab Results  Component Value Date   HDL 54.40 12/09/2016   Lab Results  Component Value Date   LDLCALC 151 (H) 12/09/2016   Lab Results  Component Value Date   TRIG 89.0 12/09/2016   Lab Results  Component Value Date   CHOLHDL 4 12/09/2016   No results found for: HGBA1C     Assessment & Plan:   Problem List Items Addressed This Visit  Active Problems   Well woman exam - Primary    Reviewed preventive care protocols, scheduled due services, and updated immunizations Discussed nutrition, exercise, diet, and healthy lifestyle.  Refuses mammogram. Breast exam normal today.  Reviewed options re: colon cancer screening, including cologaurd vs. colonoscopy.  Risks and benefits of both were discussed and patient voiced understanding.  Patient elects:cologuard.  Cologuard ordered for patient.   Orders Placed This Encounter  Procedures  . Hepatitis C Antibody  . Well woman- non DM- CBC  . Well woman- non DM- CMET  . Well woman- non DM- lipid  . well woman- non DM- TSH         HLD (hyperlipidemia)     Encouraged heart healthy diet, increase exercise, avoid trans fats, consider a krill oil cap daily       Relevant Orders   Well woman- non DM- CBC   Well woman- non DM- CMET   Well woman- non DM- lipid   well woman- non DM- TSH   Left elbow pain    Consistent  with tennis elbow. Exercises from sports medicine advisor given to patient. Also discussed getting an elbow strap. The patient indicates understanding of these issues and agrees with the plan.        Other Visit Diagnoses    Need for hepatitis C screening test       Relevant Orders   Hepatitis C Antibody   Well woman- non DM- CBC   Encounter for screening mammogram for malignant neoplasm of breast          I am having Betty Smith maintain her multivitamin, co-enzyme Q-10, Probiotic Product (PROBIOTIC DAILY PO), Turmeric, Omega-3, Ginseng, Elderberry, and calcium-vitamin D.  No orders of the defined types were placed in this encounter.   This visit occurred during the SARS-CoV-2 public health emergency.  Safety protocols were in place, including screening questions prior to the visit, additional usage of staff PPE, and extensive cleaning of exam room while observing appropriate contact time as indicated for disinfecting solutions.    Ruthe Mannanalia Betty Birks, MD

## 2019-09-25 NOTE — Assessment & Plan Note (Addendum)
  Encouraged heart healthy diet, increase exercise, avoid trans fats, consider a krill oil cap daily. Low ascvd score.

## 2019-09-25 NOTE — Patient Instructions (Addendum)
  Great to see you. I will call you with your lab results from today and you can view them online.   Happy Holidays!  Say hi to your family for me.   Cologuard will be mailed to your house.

## 2019-09-25 NOTE — Assessment & Plan Note (Addendum)
Reviewed preventive care protocols, scheduled due services, and updated immunizations Discussed nutrition, exercise, diet, and healthy lifestyle.  Refuses mammogram. Breast exam normal today.  Reviewed options re: colon cancer screening, including cologaurd vs. colonoscopy.  Risks and benefits of both were discussed and patient voiced understanding.  Patient elects:cologuard.  Cologuard ordered for patient.   Orders Placed This Encounter  Procedures  . Hepatitis C Antibody  . Well woman- non DM- CBC  . Well woman- non DM- CMET  . Well woman- non DM- lipid  . well woman- non DM- TSH

## 2019-09-26 ENCOUNTER — Encounter: Payer: Self-pay | Admitting: Family Medicine

## 2019-09-26 ENCOUNTER — Ambulatory Visit (INDEPENDENT_AMBULATORY_CARE_PROVIDER_SITE_OTHER): Payer: 59 | Admitting: Family Medicine

## 2019-09-26 VITALS — BP 120/90 | HR 73 | Temp 96.1°F | Ht 63.25 in | Wt 174.4 lb

## 2019-09-26 DIAGNOSIS — Z1159 Encounter for screening for other viral diseases: Secondary | ICD-10-CM | POA: Diagnosis not present

## 2019-09-26 DIAGNOSIS — Z01419 Encounter for gynecological examination (general) (routine) without abnormal findings: Secondary | ICD-10-CM | POA: Diagnosis not present

## 2019-09-26 DIAGNOSIS — M25522 Pain in left elbow: Secondary | ICD-10-CM | POA: Insufficient documentation

## 2019-09-26 DIAGNOSIS — E785 Hyperlipidemia, unspecified: Secondary | ICD-10-CM | POA: Diagnosis not present

## 2019-09-26 DIAGNOSIS — Z1231 Encounter for screening mammogram for malignant neoplasm of breast: Secondary | ICD-10-CM

## 2019-09-26 LAB — CBC WITH DIFFERENTIAL/PLATELET
Basophils Absolute: 0.1 10*3/uL (ref 0.0–0.1)
Basophils Relative: 1.2 % (ref 0.0–3.0)
Eosinophils Absolute: 0.2 10*3/uL (ref 0.0–0.7)
Eosinophils Relative: 3.9 % (ref 0.0–5.0)
HCT: 43.8 % (ref 36.0–46.0)
Hemoglobin: 14.6 g/dL (ref 12.0–15.0)
Lymphocytes Relative: 23.2 % (ref 12.0–46.0)
Lymphs Abs: 1.1 10*3/uL (ref 0.7–4.0)
MCHC: 33.4 g/dL (ref 30.0–36.0)
MCV: 87.9 fl (ref 78.0–100.0)
Monocytes Absolute: 0.5 10*3/uL (ref 0.1–1.0)
Monocytes Relative: 9.8 % (ref 3.0–12.0)
Neutro Abs: 2.9 10*3/uL (ref 1.4–7.7)
Neutrophils Relative %: 61.9 % (ref 43.0–77.0)
Platelets: 265 10*3/uL (ref 150.0–400.0)
RBC: 4.98 Mil/uL (ref 3.87–5.11)
RDW: 13.6 % (ref 11.5–15.5)
WBC: 4.7 10*3/uL (ref 4.0–10.5)

## 2019-09-26 LAB — LIPID PANEL
Cholesterol: 210 mg/dL — ABNORMAL HIGH (ref 0–200)
HDL: 44.9 mg/dL (ref >39.00)
LDL Cholesterol: 143 mg/dL — ABNORMAL HIGH (ref 0–99)
NonHDL: 164.73
Total CHOL/HDL Ratio: 5
Triglycerides: 109 mg/dL (ref 0.0–149.0)
VLDL: 21.8 mg/dL (ref 0.0–40.0)

## 2019-09-26 LAB — COMPREHENSIVE METABOLIC PANEL
ALT: 19 U/L (ref 0–35)
AST: 19 U/L (ref 0–37)
Albumin: 4.5 g/dL (ref 3.5–5.2)
Alkaline Phosphatase: 83 U/L (ref 39–117)
BUN: 16 mg/dL (ref 6–23)
CO2: 30 mEq/L (ref 19–32)
Calcium: 9.5 mg/dL (ref 8.4–10.5)
Chloride: 102 mEq/L (ref 96–112)
Creatinine, Ser: 0.63 mg/dL (ref 0.40–1.20)
GFR: 95.81 mL/min (ref >60.00)
Glucose, Bld: 107 mg/dL — ABNORMAL HIGH (ref 70–99)
Potassium: 4 mEq/L (ref 3.5–5.1)
Sodium: 137 mEq/L (ref 135–145)
Total Bilirubin: 0.6 mg/dL (ref 0.2–1.2)
Total Protein: 6.9 g/dL (ref 6.0–8.3)

## 2019-09-26 LAB — TSH: TSH: 1.52 u[IU]/mL (ref 0.35–4.50)

## 2019-09-26 NOTE — Assessment & Plan Note (Signed)
Consistent with tennis elbow. Exercises from sports medicine advisor given to patient. Also discussed getting an elbow strap. The patient indicates understanding of these issues and agrees with the plan.

## 2019-09-27 LAB — HEPATITIS C ANTIBODY
Hepatitis C Ab: NONREACTIVE
SIGNAL TO CUT-OFF: 0.01 (ref <1.00)

## 2019-10-13 LAB — COLOGUARD: Cologuard: NEGATIVE

## 2019-11-22 ENCOUNTER — Encounter: Payer: Self-pay | Admitting: Family Medicine

## 2020-08-04 ENCOUNTER — Encounter: Payer: 59 | Admitting: Family Medicine

## 2020-08-12 ENCOUNTER — Other Ambulatory Visit: Payer: Self-pay

## 2020-08-12 ENCOUNTER — Encounter: Payer: Self-pay | Admitting: Family Medicine

## 2020-08-12 ENCOUNTER — Ambulatory Visit (INDEPENDENT_AMBULATORY_CARE_PROVIDER_SITE_OTHER): Payer: 59 | Admitting: Family Medicine

## 2020-08-12 VITALS — BP 126/78 | HR 86 | Temp 97.9°F | Ht 62.5 in | Wt 162.2 lb

## 2020-08-12 DIAGNOSIS — R079 Chest pain, unspecified: Secondary | ICD-10-CM | POA: Diagnosis not present

## 2020-08-12 DIAGNOSIS — E785 Hyperlipidemia, unspecified: Secondary | ICD-10-CM | POA: Diagnosis not present

## 2020-08-12 DIAGNOSIS — G5132 Clonic hemifacial spasm, left: Secondary | ICD-10-CM | POA: Diagnosis not present

## 2020-08-12 DIAGNOSIS — R0982 Postnasal drip: Secondary | ICD-10-CM | POA: Diagnosis not present

## 2020-08-12 DIAGNOSIS — N8110 Cystocele, unspecified: Secondary | ICD-10-CM | POA: Insufficient documentation

## 2020-08-12 DIAGNOSIS — N898 Other specified noninflammatory disorders of vagina: Secondary | ICD-10-CM | POA: Insufficient documentation

## 2020-08-12 DIAGNOSIS — R6882 Decreased libido: Secondary | ICD-10-CM | POA: Insufficient documentation

## 2020-08-12 NOTE — Assessment & Plan Note (Signed)
Try lubricant. If not improving, consider topical estrogen.

## 2020-08-12 NOTE — Assessment & Plan Note (Signed)
Low suspicion for cardiac cause of CP. Discussed ASCVD risk and that if interested in additional prevention she could consider coronary Calcium score and information provided. Otherwise continue healthy diet, regular exercise, avoiding smoking, maintaining healthy weight.

## 2020-08-12 NOTE — Progress Notes (Signed)
Subjective:     Betty Smith is a 63 y.o. female presenting for Establish Care (no concerns )     HPI  #Facial spasms - MRI 2016 and 2017 - botox offered as a treatment then  - wondering what the prognosis is for this - over the last few years would be off and on for about 2 months - this year seemed to happen more often and her children have noticed it now  #prolapse - notes low libido - feels a bulge - no difficulty controlling urine or urgency - does have some leakage if she has put off going to the bathroom - no stress incontinence - no issues with bowel control or constipation - does have push to help with BM since the operation for rectal prolaspse  #phlegm - morning throat clearing - common issue - has tried warm salt water - no itchy eyes - does get clear drainage - mainly in the morning  - denies acid reflux or heartburn symptoms  #CP - had an episode a few months ago - across the chest and would feel it in her back - would turn in bed w/o resolution - occurred 2-3 times - no treatment at the time - episodes occurred at rest in bed - no sob, stomach concerns, fast heart rate   Review of Systems   Social History   Tobacco Use  Smoking Status Former Smoker  . Quit date: 09/28/1988  . Years since quitting: 31.8  Smokeless Tobacco Never Used        Objective:    BP Readings from Last 3 Encounters:  08/12/20 126/78  09/26/19 120/90  04/17/18 (!) 142/78   Wt Readings from Last 3 Encounters:  08/12/20 162 lb 4 oz (73.6 kg)  09/26/19 174 lb 6.4 oz (79.1 kg)  04/17/18 171 lb 4 oz (77.7 kg)    BP 126/78   Pulse 86   Temp 97.9 F (36.6 C) (Temporal)   Ht 5' 2.5" (1.588 m)   Wt 162 lb 4 oz (73.6 kg)   SpO2 97%   BMI 29.20 kg/m    Physical Exam Constitutional:      General: She is not in acute distress.    Appearance: She is well-developed. She is not diaphoretic.  HENT:     Right Ear: External ear normal.     Left Ear: External  ear normal.     Nose: Nose normal.     Mouth/Throat:     Pharynx: Posterior oropharyngeal erythema (faint posterior oropharynx) present.  Eyes:     Conjunctiva/sclera: Conjunctivae normal.  Cardiovascular:     Rate and Rhythm: Normal rate and regular rhythm.     Heart sounds: No murmur heard.   Pulmonary:     Effort: Pulmonary effort is normal. No respiratory distress.     Breath sounds: Normal breath sounds. No wheezing.  Musculoskeletal:     Cervical back: Neck supple.  Skin:    General: Skin is warm and dry.     Capillary Refill: Capillary refill takes less than 2 seconds.  Neurological:     Mental Status: She is alert. Mental status is at baseline.  Psychiatric:        Mood and Affect: Mood normal.        Behavior: Behavior normal.      The 10-year ASCVD risk score Denman George DC Jr., et al., 2013) is: 4.6%   Values used to calculate the score:     Age: 64 years  Sex: Female     Is Non-Hispanic African American: No     Diabetic: No     Tobacco smoker: No     Systolic Blood Pressure: 126 mmHg     Is BP treated: No     HDL Cholesterol: 44.9 mg/dL     Total Cholesterol: 210 mg/dL       Assessment & Plan:   Problem List Items Addressed This Visit      Genitourinary   Prolapse of vaginal wall    Hx of rectal prolapse correction. No symptoms currently apart from pressure. Discussed watch and wait. Exam deferred. Consider pelvic floor PT if bothersome prior to surgery.       Vaginal dryness    Try lubricant. If not improving, consider topical estrogen.         Other   Hemifacial spasm - Primary    Reviewed Dr. Don Perking note from 2017 with recommendation for repeat imaging in 1 year and consideration for NSG evaluation vs botox. Pt did not f/u but is interested now. Referral to Dr. Arbutus Leas for follow-up will defer repeat imaging to her office.       Relevant Orders   Ambulatory referral to Neurology   HLD (hyperlipidemia)    Low suspicion for cardiac cause of CP.  Discussed ASCVD risk and that if interested in additional prevention she could consider coronary Calcium score and information provided. Otherwise continue healthy diet, regular exercise, avoiding smoking, maintaining healthy weight.       Postnasal drip    Trial of OTC allergy treatment and Flonase for daily phlegm production.       Chest pain    Suspect it may be anxiety vs reflux with symptoms occurring laying down in bed. No symptoms for a few months and no symptoms with exercise. Continue to monitor and consider cardiac eval per HDL plan      Low libido    Briefly discussed sex/couples therapist. But will focus on treating vaginal dryness and consider additional options if not improved.           Return if symptoms worsen or fail to improve.  Lynnda Child, MD  This visit occurred during the SARS-CoV-2 public health emergency.  Safety protocols were in place, including screening questions prior to the visit, additional usage of staff PPE, and extensive cleaning of exam room while observing appropriate contact time as indicated for disinfecting solutions.

## 2020-08-12 NOTE — Assessment & Plan Note (Signed)
Reviewed Dr. Don Perking note from 2017 with recommendation for repeat imaging in 1 year and consideration for NSG evaluation vs botox. Pt did not f/u but is interested now. Referral to Dr. Arbutus Leas for follow-up will defer repeat imaging to her office.

## 2020-08-12 NOTE — Patient Instructions (Addendum)
Phlegm - Saline rinse at night - if a lot of drainage - 24 hour allergy - claritin, zyrtec, allegra - Flonase - nasal steroid   If after 2 weeks no change  You could consider a course of heartburn  -- Either Omeprazole or Famotidine   #Referral I have placed a referral to a specialist for you. You should receive a phone call from the specialty office. Make sure your voicemail is not full and that if you are able to answer your phone to unknown or new numbers.   It may take up to 2 weeks to hear about the referral. If you do not hear anything in 2 weeks, please call our office and ask to speak with the referral coordinator.   Coronary Calcium Scan A coronary calcium scan is an imaging test used to look for deposits of plaque in the inner lining of the blood vessels of the heart (coronary arteries). Plaque is made up of calcium, protein, and fatty substances. These deposits of plaque can partly clog and narrow the coronary arteries without producing any symptoms or warning signs. This puts a person at risk for a heart attack. This test is recommended for people who are at moderate risk for heart disease. The test can find plaque deposits before symptoms develop. Tell a health care provider about:  Any allergies you have.  All medicines you are taking, including vitamins, herbs, eye drops, creams, and over-the-counter medicines.  Any problems you or family members have had with anesthetic medicines.  Any blood disorders you have.  Any surgeries you have had.  Any medical conditions you have.  Whether you are pregnant or may be pregnant. What are the risks? Generally, this is a safe procedure. However, problems may occur, including:  Harm to a pregnant woman and her unborn baby. This test involves the use of radiation. Radiation exposure can be dangerous to a pregnant woman and her unborn baby. If you are pregnant or think you may be pregnant, you should not have this procedure  done.  Slight increase in the risk of cancer. This is because of the radiation involved in the test. What happens before the procedure? Ask your health care provider for any specific instructions on how to prepare for this procedure. You may be asked to avoid products that contain caffeine, tobacco, or nicotine for 4 hours before the procedure. What happens during the procedure?   You will undress and remove any jewelry from your neck or chest.  You will put on a hospital gown.  Sticky electrodes will be placed on your chest. The electrodes will be connected to an electrocardiogram (ECG) machine to record a tracing of the electrical activity of your heart.  You will lie down on a curved bed that is attached to the CT scanner.  You may be given medicine to slow down your heart rate so that clear pictures can be created.  You will be moved into the CT scanner, and the CT scanner will take pictures of your heart. During this time, you will be asked to lie still and hold your breath for 2-3 seconds at a time while each picture of your heart is being taken. The procedure may vary among health care providers and hospitals. What happens after the procedure?  You can get dressed.  You can return to your normal activities.  It is up to you to get the results of your procedure. Ask your health care provider, or the department that is doing  the procedure, when your results will be ready. Summary  A coronary calcium scan is an imaging test used to look for deposits of plaque in the inner lining of the blood vessels of the heart (coronary arteries). Plaque is made up of calcium, protein, and fatty substances.  Generally, this is a safe procedure. Tell your health care provider if you are pregnant or may be pregnant.  Ask your health care provider for any specific instructions on how to prepare for this procedure.  A CT scanner will take pictures of your heart.  You can return to your normal  activities after the scan is done. This information is not intended to replace advice given to you by your health care provider. Make sure you discuss any questions you have with your health care provider. Document Revised: 04/17/2019 Document Reviewed: 04/17/2019 Elsevier Patient Education  2020 [employer].

## 2020-08-12 NOTE — Assessment & Plan Note (Signed)
Hx of rectal prolapse correction. No symptoms currently apart from pressure. Discussed watch and wait. Exam deferred. Consider pelvic floor PT if bothersome prior to surgery.

## 2020-08-12 NOTE — Assessment & Plan Note (Signed)
Trial of OTC allergy treatment and Flonase for daily phlegm production.

## 2020-08-12 NOTE — Assessment & Plan Note (Signed)
Suspect it may be anxiety vs reflux with symptoms occurring laying down in bed. No symptoms for a few months and no symptoms with exercise. Continue to monitor and consider cardiac eval per HDL plan

## 2020-08-12 NOTE — Assessment & Plan Note (Signed)
Briefly discussed sex/couples therapist. But will focus on treating vaginal dryness and consider additional options if not improved.

## 2020-08-22 ENCOUNTER — Encounter: Payer: Self-pay | Admitting: Neurology

## 2020-09-02 NOTE — Progress Notes (Signed)
Assessment/Plan:   1. Hemifacial spasm, now with mild left facial droop due to longstanding nature  -Etiology for the hemifacial spasm is torturous left vertebral artery, causing contact and mass-effect on left facial, left acoustic, left trigeminal nerves. While Botox may be a symptomatic option, I still think she needs neurosurgery or interventional radiology opinion.  She was agreeable to the interventional radiology opinion and we will send her to Dr. Corliss Skains.  -Discussed Botox in detail along with risks and benefits and she really does not want that right now.  She will let me know if she changes her mind.  -We did discuss that she has developed some left facial droop due to hemifacial spasm, although it is very mild.  She is still able to equally activate the muscles of facial expression.  With the course of time, however, this could change.  We discussed that if this got worse, that Botox could stop the spasm and help the progression, although would not undo the facial droop that already happened.  She actually does not notice the mild decreased nasolabial fold on the left currently, nor does her husband.  -Patient asked questions today and I answered those to the best of my ability.  -She will follow up with me on an as-needed basis.  Happy to see her back if she changes her mind on treatment. Subjective:   Betty Smith was seen today in neurologic consultation at the request of Lynnda Child, MD.  The consultation is for the evaluation of hemifacial spasm. Pt with husband who supplements hx.  I have seen her for this in the past, but not for almost 5 years. I last saw her, she had completed an MRI of the brain which demonstrated an elongated and tortuous left vertebral artery that protruded into the left CP angle and contacted the left cranial nerve VII and VIII. Because of that, we recommended consultation with neurosurgery or interventional radiology. Ultimately, she declined the  consult. Because of that, I recommended that we repeat the scan, which we did and it continued to demonstrate a dominant, tortuous left vertebral artery, exerting mass-effect on the left facial and left acoustic and left trigeminal nerves. We continue to recommend consultation with neurosurgery or interventional radiology, but she declined.  She states that she has done well until this year.  When stressed or nervous or when reading, she will note twitching of the L eye.  She will close an eye when watching TV and she is not sure if it is due to that or because of one near sited and one far sited eye.  No diplopia.  No twitching on the other side.  No facial pain.     ALLERGIES:  No Known Allergies  CURRENT MEDICATIONS:  Outpatient Encounter Medications as of 09/10/2020  Medication Sig  . calcium-vitamin D (OSCAL WITH D) 500-200 MG-UNIT tablet Take 1 tablet by mouth.  Lucila Maine 575 MG/5ML SYRP Take by mouth daily.   . Ginseng 100 MG CAPS Take by mouth daily.   . Multiple Vitamin (MULTIVITAMIN) tablet Take 1 tablet by mouth daily.  . NON FORMULARY Brain Energizer tablet once a day  . Probiotic Product (PROBIOTIC DAILY PO) Take by mouth daily.  . Turmeric (QC TUMERIC COMPLEX) 500 MG CAPS Take by mouth daily.   . [DISCONTINUED] Omega-3 1000 MG CAPS Take by mouth. (Patient not taking: Reported on 09/10/2020)   No facility-administered encounter medications on file as of 09/10/2020.    Objective:  PHYSICAL EXAMINATION:    VITALS:   Vitals:   09/10/20 0838  BP: (!) 155/94  Pulse: 74  SpO2: 98%  Weight: 167 lb (75.8 kg)  Height: 5' 2.5" (1.588 m)    GEN:  Normal appears female in no acute distress.  Appears stated age. HEENT:  Normocephalic, atraumatic. The mucous membranes are moist. The superficial temporal arteries are without ropiness or tenderness. Cardiovascular: Regular rate and rhythm. Lungs: Clear to auscultation bilaterally. Neck/Heme: There are no carotid bruits noted  bilaterally.  NEUROLOGICAL: Orientation:  The patient is alert and oriented x 3.   Cranial nerves: There is mild L facial droop when in the resting postion (decreased NL fold on the L).  Able to activate mm of facial expression well with smileExtraocular muscles are intact and visual fields are full to confrontational testing. Speech is fluent and clear. Soft palate rises symmetrically and there is no tongue deviation. Hearing is intact to conversational tone. Tone: Tone is good throughout. Sensation: Sensation is intact to light touch throughout (facial, trunk, extremities). There is no extinction with double simultaneous stimulation. There is no sensory dermatomal level identified. Coordination:  The patient has no difficulty with RAM's or FNF bilaterally. Motor: Strength is 5/5 in the bilateral upper and lower extremities.  Shoulder shrug is equal and symmetric. There is no pronator drift.  There are no fasciculations noted. DTR's: Deep tendon reflexes are 2-2+/4 at the bilateral biceps, triceps, brachioradialis, patella and achilles.  Plantar responses are downgoing bilaterally. Gait and Station: The patient is able to ambulate without difficulty.  Abnormal movements: There is an occasional twitch and closure of the left eye.  It slightly involves the left zygomaticus.  It is worse when the muscles of facial expression are activated.   Total time spent on today's visit was 45 minutes, including both face-to-face time and nonface-to-face time.  Time included that spent on review of records (prior notes available to me/labs/imaging if pertinent), discussing treatment and goals, answering patient's questions and coordinating care.   Cc:  Lynnda Child, MD

## 2020-09-10 ENCOUNTER — Ambulatory Visit (INDEPENDENT_AMBULATORY_CARE_PROVIDER_SITE_OTHER): Payer: 59 | Admitting: Neurology

## 2020-09-10 ENCOUNTER — Other Ambulatory Visit: Payer: Self-pay

## 2020-09-10 ENCOUNTER — Encounter: Payer: Self-pay | Admitting: Neurology

## 2020-09-10 VITALS — BP 155/94 | HR 74 | Ht 62.5 in | Wt 167.0 lb

## 2020-09-10 DIAGNOSIS — I771 Stricture of artery: Secondary | ICD-10-CM | POA: Diagnosis not present

## 2020-09-10 DIAGNOSIS — G5132 Clonic hemifacial spasm, left: Secondary | ICD-10-CM

## 2020-09-15 ENCOUNTER — Other Ambulatory Visit (HOSPITAL_COMMUNITY): Payer: Self-pay | Admitting: Neurology

## 2020-09-15 ENCOUNTER — Telehealth (HOSPITAL_COMMUNITY): Payer: Self-pay | Admitting: Radiology

## 2020-09-15 DIAGNOSIS — G5139 Clonic hemifacial spasm, unspecified: Secondary | ICD-10-CM

## 2020-09-15 NOTE — Telephone Encounter (Signed)
Called pt, left VM for her to call to schedule consult with Deveshwar. JM 

## 2020-09-24 ENCOUNTER — Ambulatory Visit
Admission: RE | Admit: 2020-09-24 | Discharge: 2020-09-24 | Disposition: A | Discharge status: Home / Self Care | Payer: Self-pay | Source: Ambulatory Visit | Attending: Interventional Radiology | Admitting: Interventional Radiology

## 2020-09-24 ENCOUNTER — Ambulatory Visit (HOSPITAL_COMMUNITY)
Admission: RE | Admit: 2020-09-24 | Discharge: 2020-09-24 | Disposition: A | Discharge status: Home / Self Care | Payer: 59 | Source: Ambulatory Visit | Attending: Neurology | Admitting: Neurology

## 2020-09-24 ENCOUNTER — Other Ambulatory Visit: Payer: Self-pay

## 2020-09-24 ENCOUNTER — Other Ambulatory Visit (HOSPITAL_COMMUNITY): Payer: Self-pay | Admitting: Interventional Radiology

## 2020-09-24 DIAGNOSIS — R52 Pain, unspecified: Secondary | ICD-10-CM

## 2020-09-24 DIAGNOSIS — G5139 Clonic hemifacial spasm, unspecified: Secondary | ICD-10-CM

## 2020-09-25 HISTORY — PX: IR RADIOLOGIST EVAL & MGMT: IMG5224

## 2020-09-29 ENCOUNTER — Other Ambulatory Visit (HOSPITAL_COMMUNITY)
Admission: RE | Admit: 2020-09-29 | Discharge: 2020-09-29 | Disposition: A | Discharge status: Home / Self Care | Payer: 59 | Source: Ambulatory Visit | Attending: Family Medicine | Admitting: Family Medicine

## 2020-09-29 ENCOUNTER — Ambulatory Visit (INDEPENDENT_AMBULATORY_CARE_PROVIDER_SITE_OTHER): Payer: 59 | Admitting: Family Medicine

## 2020-09-29 ENCOUNTER — Other Ambulatory Visit: Payer: Self-pay

## 2020-09-29 ENCOUNTER — Encounter: Payer: Self-pay | Admitting: Family Medicine

## 2020-09-29 VITALS — BP 130/80 | HR 74 | Temp 97.3°F | Ht 62.5 in | Wt 162.5 lb

## 2020-09-29 DIAGNOSIS — Z0000 Encounter for general adult medical examination without abnormal findings: Secondary | ICD-10-CM

## 2020-09-29 DIAGNOSIS — Z124 Encounter for screening for malignant neoplasm of cervix: Secondary | ICD-10-CM

## 2020-09-29 LAB — LIPID PANEL
Cholesterol: 199 mg/dL (ref 0–200)
HDL: 56.5 mg/dL (ref >39.00)
LDL Cholesterol: 106 mg/dL — ABNORMAL HIGH (ref 0–99)
NonHDL: 142.5
Total CHOL/HDL Ratio: 4
Triglycerides: 182 mg/dL — ABNORMAL HIGH (ref 0.0–149.0)
VLDL: 36.4 mg/dL (ref 0.0–40.0)

## 2020-09-29 LAB — COMPREHENSIVE METABOLIC PANEL
ALT: 16 U/L (ref 0–35)
AST: 16 U/L (ref 0–37)
Albumin: 4.5 g/dL (ref 3.5–5.2)
Alkaline Phosphatase: 88 U/L (ref 39–117)
BUN: 14 mg/dL (ref 6–23)
CO2: 31 mEq/L (ref 19–32)
Calcium: 9.4 mg/dL (ref 8.4–10.5)
Chloride: 102 mEq/L (ref 96–112)
Creatinine, Ser: 0.6 mg/dL (ref 0.40–1.20)
GFR: 95.92 mL/min (ref >60.00)
Glucose, Bld: 85 mg/dL (ref 70–99)
Potassium: 4.3 mEq/L (ref 3.5–5.1)
Sodium: 139 mEq/L (ref 135–145)
Total Bilirubin: 0.5 mg/dL (ref 0.2–1.2)
Total Protein: 6.7 g/dL (ref 6.0–8.3)

## 2020-09-29 LAB — CBC
HCT: 42.2 % (ref 36.0–46.0)
Hemoglobin: 14.2 g/dL (ref 12.0–15.0)
MCHC: 33.7 g/dL (ref 30.0–36.0)
MCV: 86.9 fl (ref 78.0–100.0)
Platelets: 264 10*3/uL (ref 150.0–400.0)
RBC: 4.86 Mil/uL (ref 3.87–5.11)
RDW: 13.4 % (ref 11.5–15.5)
WBC: 4.7 10*3/uL (ref 4.0–10.5)

## 2020-09-29 LAB — TSH: TSH: 2.16 u[IU]/mL (ref 0.35–4.50)

## 2020-09-29 NOTE — Patient Instructions (Addendum)
Would recommend the following for bone health:   1) 800 units of Vitamin D daily 2) Get 1200 mg of elemental calcium --- this is best from your diet. Try to track how much calcium you get on a typical day. You could find ways to add more (dairy products, leafy greens). Take a supplement for whatever you don't typically get so you reach 1200 mg of calcium.  3) Physical activity (ideally weight bearing) - like walking briskly 30 minutes 5 days a week.   Please call the location of your choice from the menu below to schedule your Mammogram and/or Bone Density appointment.    [place]  [facility]. [facility] at [facility]   Phone:  [phone]   [address]                                                                            [place], [place] [address]                                            Services: 3D Mammogram and Bone Density   The breast exam is not as good at screening for breast cancer as a mammogram and I would recommend you consider getting a mammogram at least every 2 years  Skin spot - try lotion  - Can try hydrocortisone cream too - if not improving with hydrocortisone -- let me know

## 2020-09-29 NOTE — Progress Notes (Signed)
Annual Exam   Chief Complaint:  Chief Complaint  Patient presents with  . Annual Exam  . Skin Problem    Small brown spot on back that constantly itches.   . Gynecologic Exam    History of Present Illness:  Betty Smith is a 64 y.o. No obstetric history on file. who LMP was No LMP recorded. Patient is postmenopausal., presents today for her annual examination.     Nutrition She does get adequate calcium and Vitamin D in her diet. Diet: well rounded diet Exercise: stretch and exercise w/ husband 5/7 days a week  Safety The patient wears seatbelts: yes.     The patient feels safe at home and in their relationships: yes.  Dentist: yes Eye doctor: yes  Menstrual:  Symptoms of menopause: decreased libido, dry skin   GYN She is single partner, contraception - post menopausal status.    Cervical Cancer Screening (21-65):   Last Pap:   March 2018 Results were: no abnormalities /neg HPV DNA   Breast Cancer Screening (Age 8-74):  There is no FH of breast cancer. There is no FH of ovarian cancer. BRCA screening Not Indicated.  Last Mammogram: 2017 The patient does not want a mammogram this year.    Colon Cancer Screening:  Age 72-75 yo - benefits outweigh the risk. Adults 67-85 yo who have never been screened benefit.  Benefits: 134000 people in 2016 will be diagnosed and 49,000 will die - early detection helps Harms: Complications 2/2 to colonoscopy High Risk (Colonoscopy): genetic disorder (Lynch syndrome or familial adenomatous polyposis), personal hx of IBD, previous adenomatous polyp, or previous colorectal cancer, FamHx start 10 years before the age at diagnosis, increased in males and black race  Options:  FIT - looks for hemoglobin (blood in the stool) - specific and fairly sensitive - must be done annually Cologuard - looks for DNA and blood - more sensitive - therefore can have more false positives, every 3 years Colonoscopy - every 10 years if normal -  sedation, bowl prep, must have someone drive you  Shared decision making and the patient had decided to do cologuard - done in 2020.   Social History   Tobacco Use  Smoking Status Former Smoker  . Quit date: 09/28/1988  . Years since quitting: 32.0  Smokeless Tobacco Never Used    Lung Cancer Screening (Ages 41-93): not applicable   Weight Wt Readings from Last 3 Encounters:  09/29/20 162 lb 8 oz (73.7 kg)  09/10/20 167 lb (75.8 kg)  08/12/20 162 lb 4 oz (73.6 kg)   Patient has high BMI  BMI Readings from Last 1 Encounters:  09/29/20 29.25 kg/m     Chronic disease screening Blood pressure monitoring:  BP Readings from Last 3 Encounters:  09/29/20 130/80  09/10/20 (!) 155/94  08/12/20 126/78    Lipid Monitoring: Indication for screening: age >80, obesity, diabetes, family hx, CV risk factors.  Lipid screening: Yes  Lab Results  Component Value Date   CHOL 210 (H) 09/26/2019   HDL 44.90 09/26/2019   LDLCALC 143 (H) 09/26/2019   TRIG 109.0 09/26/2019   CHOLHDL 5 09/26/2019     Diabetes Screening: age >29, overweight, family hx, PCOS, hx of gestational diabetes, at risk ethnicity Diabetes Screening screening: Yes  No results found for: HGBA1C   Past Medical History:  Diagnosis Date  . Hemifacial spasm    left    Past Surgical History:  Procedure Laterality Date  . CESAREAN SECTION  1983, 1985  . IR RADIOLOGIST EVAL & MGMT  09/25/2020  . RECTAL PROLAPSE REPAIR    . TONSILLECTOMY AND ADENOIDECTOMY    . WISDOM TOOTH EXTRACTION      Prior to Admission medications   Medication Sig Start Date End Date Taking? Authorizing Provider  calcium-vitamin D (OSCAL WITH D) 500-200 MG-UNIT tablet Take 1 tablet by mouth.   Yes [provider]  Elderberry 575 MG/5ML SYRP Take by mouth daily.    Yes [provider]  Ginseng 100 MG CAPS Take by mouth daily.    Yes [provider]  Multiple Vitamin (MULTIVITAMIN) tablet Take 1 tablet by  mouth daily.   Yes [provider]  NON FORMULARY Brain Energizer tablet once a day   Yes [provider]  Probiotic Product (PROBIOTIC DAILY PO) Take by mouth daily.   Yes [provider]  Turmeric 500 MG CAPS Take by mouth daily.    Yes [provider]    No Known Allergies  Gynecologic History: No LMP recorded. Patient is postmenopausal.  Obstetric History: No obstetric history on file.  Social History   Socioeconomic History  . Marital status: Married    Spouse name: Betty Smith  . Number of children: 12  . Years of education: high school  . Highest education level: Not on file  Occupational History  . Occupation: farmers market    Comment: also Development worker, community  Tobacco Use  . Smoking status: Former Smoker    Quit date: 09/28/1988    Years since quitting: 32.0  . Smokeless tobacco: Never Used  Vaping Use  . Vaping Use: Never used  Substance and Sexual Activity  . Alcohol use: Yes    Alcohol/week: 0.0 standard drinks    Comment: a couple of times a month  . Drug use: No  . Sexual activity: Yes    Birth control/protection: Post-menopausal  Other Topics Concern  . Not on file  Social History Narrative   08/12/20   From: [place], moved to [facility] [place]   Living: with Husband, Betty Smith (1979)   Work: at the [employer]      Family: 12 children, 13 grandchildren      Enjoys: cleaning the house, make things, cook, walking      Exercise: walking all the time   Diet: junk food, but tries to eat healthy - veggies/fruits/meats, limits pasta      Safety   Seat belts: Yes    Guns: No   Safe in relationships: Yes    Social Determinants of Radio broadcast assistant Strain: Not on file  Food Insecurity: Not on file  Transportation Needs: Not on file  Physical Activity: Not on file  Stress: Not on file  Social Connections: Not on file  Intimate Partner Violence: Not on file    Family History  Problem Relation Age of Onset  . Arthritis  Mother   . Esophageal cancer Mother   . Pancreatic cancer Father 43  . Stroke Brother   . Pancreatic cancer Brother 64  . Breast cancer Maternal Grandmother   . Skin cancer Paternal Grandmother        unsure what type    Review of Systems  Constitutional: Negative for chills and fever.  HENT: Negative for congestion and sore throat.   Eyes: Negative for blurred vision and double vision.  Respiratory: Negative for shortness of breath.   Cardiovascular: Negative for chest pain.  Gastrointestinal: Negative for heartburn, nausea and vomiting.  Genitourinary:  Negative.   Musculoskeletal: Negative.  Negative for myalgias.  Skin: Negative for rash.  Neurological: Negative for dizziness and headaches.  Endo/Heme/Allergies: Does not bruise/bleed easily.  Psychiatric/Behavioral: Negative for depression. The patient is not nervous/anxious.      Physical Exam BP 130/80   Pulse 74   Temp (!) 97.3 F (36.3 C) (Temporal)   Ht 5' 2.5" (1.588 m)   Wt 162 lb 8 oz (73.7 kg)   SpO2 99%   BMI 29.25 kg/m    BP Readings from Last 3 Encounters:  09/29/20 130/80  09/10/20 (!) 155/94  08/12/20 126/78      Physical Exam Exam conducted with a chaperone present.  Constitutional:      General: She is not in acute distress.    Appearance: She is well-developed and well-nourished. She is not diaphoretic.  HENT:     Head: Normocephalic and atraumatic.     Right Ear: External ear normal.     Left Ear: External ear normal.     Nose: Nose normal.     Mouth/Throat:     Mouth: Oropharynx is clear and moist.  Eyes:     General: No scleral icterus.    Extraocular Movements: EOM normal.     Conjunctiva/sclera: Conjunctivae normal.  Cardiovascular:     Rate and Rhythm: Normal rate and regular rhythm.     Heart sounds: No murmur heard.   Pulmonary:     Effort: Pulmonary effort is normal. No respiratory distress.     Breath sounds: Normal breath sounds. No wheezing.  Chest:  Breasts:      Right: Normal.     Left: Normal.    Abdominal:     General: Bowel sounds are normal. There is no distension.     Palpations: Abdomen is soft. There is no mass.     Tenderness: There is no abdominal tenderness. There is no guarding or rebound.  Genitourinary:    Vagina: Prolapsed vaginal walls present.     Comments: External os dilated on exam Musculoskeletal:        General: No edema. Normal range of motion.     Cervical back: Neck supple.  Lymphadenopathy:     Cervical: No cervical adenopathy.  Skin:    General: Skin is warm and dry.     Capillary Refill: Capillary refill takes less than 2 seconds.  Neurological:     Mental Status: She is alert and oriented to person, place, and time.     Deep Tendon Reflexes: Strength normal. Reflexes normal.  Psychiatric:        Mood and Affect: Mood and affect normal.        Behavior: Behavior normal.     Results:  PHQ-9:   Depression screen [facility] 2/9 08/12/2020 09/26/2019 12/09/2016  Decreased Interest 0 0 0  Down, Depressed, Hopeless 0 0 0  PHQ - 2 Score 0 0 0       Assessment: 64 y.o. No obstetric history on file. female here for routine annual physical examination.  Plan: Problem List Items Addressed This Visit   None   Visit Diagnoses    Annual physical exam    -  Primary   Relevant Orders   Comprehensive metabolic panel   Lipid panel   CBC   TSH   Cervical cancer screening       Relevant Orders   Cytology - PAP      Screening: -- Blood pressure screen normal -- cholesterol screening: will obtain -- Weight  screening: overweight: continue to monitor -- Diabetes Screening: will obtain -- Nutrition: Encouraged healthy diet  The 10-year ASCVD risk score Mikey Bussing DC Jr., et al., 2013) is: 4.9%   Values used to calculate the score:     Age: 10 years     Sex: Female     Is Non-Hispanic African American: No     Diabetic: No     Tobacco smoker: No     Systolic Blood Pressure: 104 mmHg     Is BP treated: No     HDL  Cholesterol: 44.9 mg/dL     Total Cholesterol: 210 mg/dL  -- Statin therapy for Age 101-75 with CVD risk >7.5%  Psych -- Depression screening (PHQ-9):    Safety -- tobacco screening: not using -- alcohol screening:  low-risk usage. -- no evidence of domestic violence or intimate partner violence.   Cancer Screening -- pap smear collected per ASCCP guidelines -- family history of breast cancer screening: done. not at high risk. -- Mammogram - pt will consider. Breast exam normal today but discussed this is inadequate for early detection and breast cancer death prevention -- Colon cancer (age 81+)-- up to date  Immunizations Immunization History  Administered Date(s) Administered  . Moderna Sars-Covid-2 Vaccination 07/16/2020, 08/13/2020     -- flu vaccine declined -- TDAP q10 years unknown -- Shingles (age >83) declined -- Covid-19 Vaccine up to date   Encouraged healthy diet and exercise. Encouraged regular vision and dental care.    Lesleigh Noe, MD

## 2020-10-01 LAB — CYTOLOGY - PAP
Comment: NEGATIVE
Diagnosis: NEGATIVE
High risk HPV: NEGATIVE

## 2020-12-23 ENCOUNTER — Telehealth (HOSPITAL_COMMUNITY): Payer: Self-pay

## 2020-12-23 NOTE — Telephone Encounter (Signed)
Called to schedule mri, no answer, left vm. AW 

## 2021-08-10 ENCOUNTER — Ambulatory Visit (INDEPENDENT_AMBULATORY_CARE_PROVIDER_SITE_OTHER): Payer: 59 | Admitting: Family Medicine

## 2021-08-10 ENCOUNTER — Other Ambulatory Visit: Payer: Self-pay

## 2021-08-10 VITALS — BP 140/82 | HR 77 | Temp 96.2°F | Ht 62.6 in | Wt 176.5 lb

## 2021-08-10 DIAGNOSIS — E785 Hyperlipidemia, unspecified: Secondary | ICD-10-CM

## 2021-08-10 DIAGNOSIS — Z1231 Encounter for screening mammogram for malignant neoplasm of breast: Secondary | ICD-10-CM | POA: Diagnosis not present

## 2021-08-10 DIAGNOSIS — R001 Bradycardia, unspecified: Secondary | ICD-10-CM | POA: Diagnosis not present

## 2021-08-10 DIAGNOSIS — Z0000 Encounter for general adult medical examination without abnormal findings: Secondary | ICD-10-CM | POA: Diagnosis not present

## 2021-08-10 LAB — COMPREHENSIVE METABOLIC PANEL
ALT: 17 U/L (ref 0–35)
AST: 19 U/L (ref 0–37)
Albumin: 4.3 g/dL (ref 3.5–5.2)
Alkaline Phosphatase: 73 U/L (ref 39–117)
BUN: 12 mg/dL (ref 6–23)
CO2: 32 mEq/L (ref 19–32)
Calcium: 9.2 mg/dL (ref 8.4–10.5)
Chloride: 101 mEq/L (ref 96–112)
Creatinine, Ser: 0.66 mg/dL (ref 0.40–1.20)
GFR: 93.18 mL/min (ref >60.00)
Glucose, Bld: 99 mg/dL (ref 70–99)
Potassium: 4.1 mEq/L (ref 3.5–5.1)
Sodium: 139 mEq/L (ref 135–145)
Total Bilirubin: 0.7 mg/dL (ref 0.2–1.2)
Total Protein: 6.3 g/dL (ref 6.0–8.3)

## 2021-08-10 LAB — LIPID PANEL
Cholesterol: 203 mg/dL — ABNORMAL HIGH (ref 0–200)
HDL: 51.2 mg/dL (ref >39.00)
LDL Cholesterol: 126 mg/dL — ABNORMAL HIGH (ref 0–99)
NonHDL: 152.05
Total CHOL/HDL Ratio: 4
Triglycerides: 129 mg/dL (ref 0.0–149.0)
VLDL: 25.8 mg/dL (ref 0.0–40.0)

## 2021-08-10 LAB — CBC
HCT: 42.2 % (ref 36.0–46.0)
Hemoglobin: 14.1 g/dL (ref 12.0–15.0)
MCHC: 33.3 g/dL (ref 30.0–36.0)
MCV: 88.2 fl (ref 78.0–100.0)
Platelets: 242 10*3/uL (ref 150.0–400.0)
RBC: 4.79 Mil/uL (ref 3.87–5.11)
RDW: 13.6 % (ref 11.5–15.5)
WBC: 4.9 10*3/uL (ref 4.0–10.5)

## 2021-08-10 LAB — TSH: TSH: 1.8 u[IU]/mL (ref 0.35–5.50)

## 2021-08-10 NOTE — Patient Instructions (Addendum)
Please call the location of your choice from the menu below to schedule your Mammogram and/or Bone Density appointment.     [name]   Phone:  [phone]   [address]                                                                            [place], [place] [address]                                            Services: 3D Mammogram and Bone Density  Would recommend getting the Shingrix Vaccine (here or with your pharmacy)

## 2021-08-10 NOTE — Progress Notes (Signed)
Annual Exam   Chief Complaint:  Chief Complaint  Patient presents with   Annual Exam   Referral    Cardiology     History of Present Illness:  Ms. Betty Smith is a 65 y.o. No obstetric history on file. who LMP was No LMP recorded. Patient is postmenopausal., presents today for her annual examination.    #Irregular heart rate - has been checking on her apple watch - resting is typically 60s, but occasionally she will look down and it will be 40 - average 58-68 bpm - rare lightheadedness - often with stress or not eating, improves with sitting and resolves quickly - exercises daily w/o symptoms - does not know how to check her HR - will sometimes have a "drop" when she is low - at rest  Nutrition She does get adequate calcium and Vitamin D in her diet. Diet: fruits/veggies, some meats/snacks Exercise: regularly  Safety The patient wears seatbelts: yes.     The patient feels safe at home and in their relationships: yes.   Menstrual:  Symptoms of menopause:  no  GYN She is single partner, contraception - post menopausal status.    Cervical Cancer Screening (21-65):   Last Pap:   December 2021 Results were: no abnormalities /neg HPV DNA   Breast Cancer Screening (Age 64-74):  There is FH of breast cancer. There is no FH of ovarian cancer. BRCA screening Not Indicated.  Last Mammogram: 2017 The patient does want a mammogram this year.    Colon Cancer Screening:  Age 55-75 yo - benefits outweigh the risk. Adults 17-85 yo who have never been screened benefit.  Benefits: 134000 people in 2016 will be diagnosed and 49,000 will die - early detection helps Harms: Complications 2/2 to colonoscopy High Risk (Colonoscopy): genetic disorder (Lynch syndrome or familial adenomatous polyposis), personal hx of IBD, previous adenomatous polyp, or previous colorectal cancer, FamHx start 10 years before the age at diagnosis, increased in males and black race  Options:  FIT - looks  for hemoglobin (blood in the stool) - specific and fairly sensitive - must be done annually Cologuard - looks for DNA and blood - more sensitive - therefore can have more false positives, every 3 years Colonoscopy - every 10 years if normal - sedation, bowl prep, must have someone drive you  Shared decision making and the patient had decided to do cologuard.   Social History   Tobacco Use  Smoking Status Former   Types: Cigarettes   Quit date: 09/28/1988   Years since quitting: 32.8  Smokeless Tobacco Never    Weight Wt Readings from Last 3 Encounters:  08/10/21 176 lb 8 oz (80.1 kg)  09/29/20 162 lb 8 oz (73.7 kg)  09/10/20 167 lb (75.8 kg)   Patient has high BMI  BMI Readings from Last 1 Encounters:  08/10/21 31.67 kg/m     Chronic disease screening Blood pressure monitoring:  BP Readings from Last 3 Encounters:  08/10/21 140/82  09/29/20 130/80  09/10/20 (!) 155/94    Lipid Monitoring: Indication for screening: age >21, obesity, diabetes, family hx, CV risk factors.  Lipid screening: Yes  Lab Results  Component Value Date   CHOL 199 09/29/2020   HDL 56.50 09/29/2020   LDLCALC 106 (H) 09/29/2020   TRIG 182.0 (H) 09/29/2020   CHOLHDL 4 09/29/2020     Diabetes Screening: age >52, overweight, family hx, PCOS, hx of gestational diabetes, at risk ethnicity Diabetes Screening screening: Yes  No results found  for: HGBA1C   Past Medical History:  Diagnosis Date   Hemifacial spasm    left    Past Surgical History:  Procedure Laterality Date   [place]   IR RADIOLOGIST EVAL & MGMT  09/25/2020   RECTAL PROLAPSE REPAIR     TONSILLECTOMY AND ADENOIDECTOMY     WISDOM TOOTH EXTRACTION      Prior to Admission medications   Medication Sig Start Date End Date Taking? Authorizing Provider  calcium-vitamin D (OSCAL WITH D) 500-200 MG-UNIT tablet Take 1 tablet by mouth.   Yes [provider]  Coenzyme Q10 (COQ10 PO) Take 2 each by  mouth daily. W/ turmeric   Yes [provider]  Elderberry 575 MG/5ML SYRP Take by mouth daily.    Yes [provider]  Ginseng 100 MG CAPS Take by mouth daily.    Yes [provider]  Multiple Vitamin (MULTIVITAMIN) tablet Take 1 tablet by mouth daily.   Yes [provider]  NON FORMULARY Brain Energizer tablet once a day   Yes [provider]  Probiotic Product (PROBIOTIC DAILY PO) Take by mouth daily.   Yes [provider]    No Known Allergies  Gynecologic History: No LMP recorded. Patient is postmenopausal.  Obstetric History: No obstetric history on file.  Social History   Socioeconomic History   Marital status: Married    Spouse name: [name]   Number of children: 25   Years of education: high school   Highest education level: Not on file  Occupational History   Occupation: farmers market    Comment: also house cleaning  Tobacco Use   Smoking status: Former    Types: Cigarettes    Quit date: 09/28/1988    Years since quitting: 32.8   Smokeless tobacco: Never  Vaping Use   Vaping Use: Never used  Substance and Sexual Activity   Alcohol use: Yes    Alcohol/week: 0.0 standard drinks    Comment: a couple of times a month   Drug use: No   Sexual activity: Yes    Birth control/protection: Post-menopausal  Other Topics Concern   Not on file  Social History Narrative   08/12/20   From: [place], moved to [facility] [place]   Living: with Husband, [name] (1979)   Work: at the [employer]      Family: 12 children, 13 grandchildren      Enjoys: cleaning the house, make things, cook, walking      Exercise: walking all the time   Diet: junk food, but tries to eat healthy - veggies/fruits/meats, limits pasta      Safety   Seat belts: Yes    Guns: No   Safe in relationships: Yes    Social Determinants of Radio broadcast assistant Strain: Not on file  Food Insecurity: Not on file  Transportation Needs: Not on file   Physical Activity: Not on file  Stress: Not on file  Social Connections: Not on file  Intimate Partner Violence: Not on file    Family History  Problem Relation Age of Onset   Arthritis Mother    Esophageal cancer Mother    Pancreatic cancer Father 17   Stroke Brother    Pancreatic cancer Brother 53   Breast cancer Maternal Grandmother    Skin cancer Paternal Grandmother        unsure what type    Review of Systems  Constitutional:  Negative for chills and fever.  HENT:  Negative for congestion and sore throat.   Eyes:  Negative for blurred vision and double vision.  Respiratory:  Negative for shortness of breath.   Cardiovascular:  Negative for chest pain.  Gastrointestinal:  Negative for heartburn, nausea and vomiting.  Genitourinary: Negative.   Musculoskeletal: Negative.  Negative for myalgias.  Skin:  Negative for rash.  Neurological:  Negative for dizziness and headaches.  Endo/Heme/Allergies:  Does not bruise/bleed easily.  Psychiatric/Behavioral:  Negative for depression. The patient is not nervous/anxious.     Physical Exam BP 140/82   Pulse 77   Temp (!) 96.2 F (35.7 C) (Temporal)   Ht 5' 2.6" (1.59 m)   Wt 176 lb 8 oz (80.1 kg)   SpO2 97%   BMI 31.67 kg/m    BP Readings from Last 3 Encounters:  08/10/21 140/82  09/29/20 130/80  09/10/20 (!) 155/94      Physical Exam Constitutional:      General: She is not in acute distress.    Appearance: She is well-developed. She is not diaphoretic.  HENT:     Head: Normocephalic and atraumatic.     Right Ear: External ear normal.     Left Ear: External ear normal.     Nose: Nose normal.  Eyes:     General: No scleral icterus.    Extraocular Movements: Extraocular movements intact.     Conjunctiva/sclera: Conjunctivae normal.  Neck:     Vascular: No carotid bruit.  Cardiovascular:     Rate and Rhythm: Normal rate and regular rhythm.     Heart sounds: No murmur heard. Pulmonary:     Effort:  Pulmonary effort is normal. No respiratory distress.     Breath sounds: Normal breath sounds. No wheezing.  Abdominal:     General: Bowel sounds are normal. There is no distension.     Palpations: Abdomen is soft. There is no mass.     Tenderness: There is no abdominal tenderness. There is no guarding or rebound.  Musculoskeletal:        General: Normal range of motion.     Cervical back: Neck supple.  Lymphadenopathy:     Cervical: No cervical adenopathy.  Skin:    General: Skin is warm and dry.     Capillary Refill: Capillary refill takes less than 2 seconds.  Neurological:     Mental Status: She is alert and oriented to person, place, and time.     Deep Tendon Reflexes: Reflexes normal.  Psychiatric:        Mood and Affect: Mood normal.        Behavior: Behavior normal.    Results:  PHQ-9:  Depression screen [facility] 2/9 08/12/2020 09/26/2019 12/09/2016 12/09/2016  Decreased Interest 0 0 0 0  Down, Depressed, Hopeless 0 0 0 0  PHQ - 2 Score 0 0 0 0       Assessment: 65 y.o. No obstetric history on file. female here for routine annual physical examination.  Plan: Problem List Items Addressed This Visit       Other   HLD (hyperlipidemia)   Relevant Orders   Lipid panel   Bradycardia    Apple watch picking up resting HR 40s occasionally. Cardiology referral for more work-up.       Relevant Orders   Ambulatory referral to Cardiology   Comprehensive metabolic panel   TSH   CBC   Other Visit Diagnoses     Annual physical exam    -  Primary  Relevant Orders   Comprehensive metabolic panel   Lipid panel   TSH   CBC   Encounter for screening mammogram for malignant neoplasm of breast       Relevant Orders   MM Digital Screening       Screening: -- Blood pressure screen normal -- cholesterol screening: will obtain -- Weight screening: overweight: continue to monitor -- Diabetes Screening: will obtain -- Nutrition: Encouraged healthy diet  The 10-year ASCVD  risk score (Arnett DK, et al., 2019) is: 5.3%   Values used to calculate the score:     Age: 76 years     Sex: Female     Is Non-Hispanic African American: No     Diabetic: No     Tobacco smoker: No     Systolic Blood Pressure: 397 mmHg     Is BP treated: No     HDL Cholesterol: 56.5 mg/dL     Total Cholesterol: 199 mg/dL  -- Statin therapy for Age 60-75 with CVD risk >7.5%  Psych -- Depression screening (PHQ-9): negative   Safety -- tobacco screening: not using -- alcohol screening:  low-risk usage. -- no evidence of domestic violence or intimate partner violence.   Cancer Screening -- pap smear not collected per ASCCP guidelines -- family history of breast cancer screening: done. not at high risk. -- Mammogram - ordered -- Colon cancer (age 61+)--  up to date  Immunizations Immunization History  Administered Date(s) Administered   [employer] Vaccination 07/16/2020, 08/13/2020    -- flu vaccine not up to date - declined -- TDAP q10 years not up to date - will address next year -- Shingles (age >92) not up to date - pt will consider getting -- Covid-19 Vaccine up to date   Encouraged healthy diet and exercise. Encouraged regular vision and dental care.    Lesleigh Noe, MD

## 2021-08-10 NOTE — Assessment & Plan Note (Signed)
Apple watch picking up resting HR 40s occasionally. Cardiology referral for more work-up.

## 2021-08-11 ENCOUNTER — Encounter: Payer: Self-pay | Admitting: Family Medicine

## 2021-08-14 NOTE — Telephone Encounter (Signed)
Referral has been updated

## 2021-09-09 ENCOUNTER — Encounter: Payer: Self-pay | Admitting: Cardiology

## 2021-09-09 ENCOUNTER — Other Ambulatory Visit: Payer: Self-pay

## 2021-09-09 ENCOUNTER — Ambulatory Visit (INDEPENDENT_AMBULATORY_CARE_PROVIDER_SITE_OTHER): Payer: 59 | Admitting: Cardiology

## 2021-09-09 VITALS — BP 144/94 | HR 64 | Ht 62.0 in | Wt 178.0 lb

## 2021-09-09 DIAGNOSIS — R001 Bradycardia, unspecified: Secondary | ICD-10-CM | POA: Diagnosis not present

## 2021-09-09 DIAGNOSIS — Z87891 Personal history of nicotine dependence: Secondary | ICD-10-CM | POA: Diagnosis not present

## 2021-09-09 NOTE — Patient Instructions (Addendum)
Medication Instructions:  Your physician recommends that you continue on your current medications as directed. Please refer to the Current Medication list given to you today. *If you need a refill on your cardiac medications before your next appointment, please call your pharmacy*  Lab Work: None ordered. If you have labs (blood work) drawn today and your tests are completely normal, you will receive your results only by: MyChart Message (if you have MyChart) OR A paper copy in the mail If you have any lab test that is abnormal or we need to change your treatment, we will call you to review the results.  Testing/Procedures: \You will be scheduled for a calcium scoring CT.  Follow-Up: At [facility], you and your health needs are our priority.  As part of our continuing mission to provide you with exceptional heart care, we have created designated Provider Care Teams.  These Care Teams include your primary Cardiologist (physician) and Advanced Practice Providers (APPs -  Physician Assistants and Nurse Practitioners) who all work together to provide you with the care you need, when you need it.  Your next appointment:   Your physician wants you to follow-up in: AS NEEDED with Dr. Lalla Brothers

## 2021-09-09 NOTE — Progress Notes (Signed)
Electrophysiology Office Note:    Date:  09/09/2021   ID:  Betty Smith, DOB 03-18-1956, MRN [id number]  PCP:  Lynnda Child, MD  [facility] Cardiologist:  None  [facility] Electrophysiologist:  Lanier Prude, MD   Referring MD: Lynnda Child, MD   Chief Complaint: Bradycardia  History of Present Illness:    Betty Smith is a 65 y.o. female who presents for an evaluation of bradycardia at the request of Dr. Selena Batten.  The patient was last seen by Dr. Selena Batten August 10, 2021 for an annual physical.  At that appointment she reported intermittent low heart rates.  She also reported rare episodes of lightheadedness that were short-lived and improved with sitting.  She exercises daily without any symptoms.  Today she confirms the above.  She is with her husband in clinic.  She tells me she is checked her heart rates while at rest and she ranges between 40 and 65 bpm.  No syncope or presyncope.  She also complains of intermittent back pain at night that is alleviated with Alka-Seltzer.  No exertional symptoms.  She can climb stairs without any chest discomfort or tightness.  She does have a history of prior tobacco abuse for about 15 years.    Past Medical History:  Diagnosis Date   Hemifacial spasm    left    Past Surgical History:  Procedure Laterality Date   CESAREAN SECTION  1983, 1985   IR RADIOLOGIST EVAL & MGMT  09/25/2020   RECTAL PROLAPSE REPAIR     TONSILLECTOMY AND ADENOIDECTOMY     WISDOM TOOTH EXTRACTION      Current Medications: Current Meds  Medication Sig   calcium-vitamin D (OSCAL WITH D) 500-200 MG-UNIT tablet Take 1 tablet by mouth.   Coenzyme Q10 (COQ10 PO) Take 2 each by mouth daily. W/ turmeric   Elderberry 575 MG/5ML SYRP Take by mouth daily.    Ginseng 100 MG CAPS Take by mouth daily.    Multiple Vitamin (MULTIVITAMIN) tablet Take 1 tablet by mouth daily.   NON FORMULARY Brain Energizer tablet once a day   Omega-3 Fatty Acids (OMEGA 3 PO)  Take by mouth.   Probiotic Product (PROBIOTIC DAILY PO) Take by mouth daily.     Allergies:   Patient has no known allergies.   Social History   Socioeconomic History   Marital status: Married    Spouse name: Sherilyn Cooter   Number of children: 12   Years of education: high school   Highest education level: Not on file  Occupational History   Occupation: farmers market    Comment: also house cleaning  Tobacco Use   Smoking status: Former    Types: Cigarettes    Quit date: 09/28/1988    Years since quitting: 32.9   Smokeless tobacco: Never  Vaping Use   Vaping Use: Never used  Substance and Sexual Activity   Alcohol use: Yes    Alcohol/week: 0.0 standard drinks    Comment: a couple of times a month   Drug use: No   Sexual activity: Yes    Birth control/protection: Post-menopausal  Other Topics Concern   Not on file  Social History Narrative   08/12/20   From: [place], moved to [facility] [place]   Living: with Husband, Sherilyn Cooter (1979)   Work: at the [employer]      Family: 12 children, 13 grandchildren      Enjoys: cleaning the house, make things, cook, walking  Exercise: walking all the time   Diet: junk food, but tries to eat healthy - veggies/fruits/meats, limits pasta      Safety   Seat belts: Yes    Guns: No   Safe in relationships: Yes    Social Determinants of Corporate investment banker Strain: Not on file  Food Insecurity: Not on file  Transportation Needs: Not on file  Physical Activity: Not on file  Stress: Not on file  Social Connections: Not on file     Family History: The patient's family history includes Arthritis in her mother; Breast cancer in her maternal grandmother; Esophageal cancer in her mother; Pancreatic cancer (age of onset: 3) in her brother and father; Skin cancer in her paternal grandmother; Stroke in her brother.  ROS:   Please see the history of present illness.    All other systems reviewed and are negative.  EKGs/Labs/Other Studies  Reviewed:    The following studies were reviewed today:   EKG:  The ekg ordered today demonstrates sinus rhythm.  Normal EKG.   Recent Labs: 08/10/2021: ALT 17; BUN 12; Creatinine, Ser 0.66; Hemoglobin 14.1; Platelets 242.0; Potassium 4.1; Sodium 139; TSH 1.80  Recent Lipid Panel    Component Value Date/Time   CHOL 203 (H) 08/10/2021 0847   TRIG 129.0 08/10/2021 0847   HDL 51.20 08/10/2021 0847   CHOLHDL 4 08/10/2021 0847   VLDL 25.8 08/10/2021 0847   LDLCALC 126 (H) 08/10/2021 0847    Physical Exam:    VS:  BP (!) 144/94 (BP Location: Left Arm, Patient Position: Sitting, Cuff Size: Normal)   Pulse 64   Ht 5\' 2"  (1.575 m)   Wt 178 lb (80.7 kg)   SpO2 98%   BMI 32.56 kg/m     Wt Readings from Last 3 Encounters:  09/09/21 178 lb (80.7 kg)  08/10/21 176 lb 8 oz (80.1 kg)  09/29/20 162 lb 8 oz (73.7 kg)     GEN:  Well nourished, well developed in no acute distress HEENT: Normal NECK: No JVD; No carotid bruits LYMPHATICS: No lymphadenopathy CARDIAC: RRR, no murmurs, rubs, gallops RESPIRATORY:  Clear to auscultation without rales, wheezing or rhonchi  ABDOMEN: Soft, non-tender, non-distended MUSCULOSKELETAL:  No edema; No deformity  SKIN: Warm and dry NEUROLOGIC:  Alert and oriented x 3 PSYCHIATRIC:  Normal affect       ASSESSMENT:    1. Bradycardia   2. History of tobacco use    PLAN:    In order of problems listed above:  #Bradycardia She has normal sinus rhythm.  Her heart rate ranges are appropriate.  No permanent pacing is indicated.  #Back pain and history of tobacco use Discussed her cardiovascular risk factors.  I do think she should have a coronary artery calcium score to further risk stratify her.  We discussed the possible results which could lead to a recommendation for statin therapy.  We will put order in for this.  Follow-up with me as needed.   Medication Adjustments/Labs and Tests Ordered: Current medicines are reviewed at length with  the patient today.  Concerns regarding medicines are outlined above.  No orders of the defined types were placed in this encounter.  No orders of the defined types were placed in this encounter.    Signed, 10/01/20. Rossie Muskrat, MD, [facility], [facility] 09/09/2021 11:58 AM    Electrophysiology [facility]

## 2021-11-24 ENCOUNTER — Telehealth (INDEPENDENT_AMBULATORY_CARE_PROVIDER_SITE_OTHER): Payer: 59 | Admitting: Family Medicine

## 2021-11-24 ENCOUNTER — Other Ambulatory Visit: Payer: Self-pay

## 2021-11-24 ENCOUNTER — Telehealth: Payer: Self-pay

## 2021-11-24 ENCOUNTER — Encounter: Payer: Self-pay | Admitting: Family Medicine

## 2021-11-24 VITALS — Temp 98.2°F | Ht 62.6 in | Wt 166.0 lb

## 2021-11-24 DIAGNOSIS — U071 COVID-19: Secondary | ICD-10-CM | POA: Insufficient documentation

## 2021-11-24 MED ORDER — MOLNUPIRAVIR EUA 200MG CAPSULE: 4.0000 | ORAL_CAPSULE | Freq: Two times a day (BID) | ORAL | 0 refills | Status: AC

## 2021-11-24 NOTE — Progress Notes (Signed)
VIRTUAL VISIT Due to national recommendations of social distancing due to COVID 19, a virtual visit is felt to be most appropriate for this patient at this time.   I connected with the patient on 11/24/21 at  2:20 PM EST by virtual telehealth platform and verified that I am speaking with the correct person using two identifiers.   I discussed the limitations, risks, security and privacy concerns of performing an evaluation and management service by  virtual telehealth platform and the availability of in person appointments. I also discussed with the patient that there may be a patient responsible charge related to this service. The patient expressed understanding and agreed to proceed.  Patient location: Home Provider Location: [facility] Participants: Kerby Nora and Sallye Ober   Chief Complaint  Patient presents with   Covid Positive    C/o HA, body aches and cough.  Sxs started 11/23/21.  Pos COVID home test 09/22/22.  Had 2 initial COVID Moderna shots- 07/2020, 08/2020.  Tried Elderberry, Dayquil/Nyquil and immune support drops.     History of Present Illness: 66 year old female patient of Dr. Elmyra Ricks with history of bradycardia and BMI 29 presents with new COVID infection.  Date of Onset: 11/23/2021   She reports  initially she started with decrease appetite, body aches and severe headaches.  Today runny nose and cough, productive.  No fever.  No SOB, no wheeze.  No ST , no ear pain.   Using elderberry, dayquil and nyquil and tylenol.  No chronic lung disease  COVID 19 screen COVID testing: positive home test 2/13 COVID vaccine:08/13/2020 , 07/16/2020 COVID exposure: No recent travel or known exposure to COVID19  The importance of social distancing was discussed today.    Review of Systems  Constitutional:  Positive for malaise/fatigue. Negative for chills and fever.  HENT:  Positive for congestion. Negative for ear pain.   Eyes:  Negative for pain and redness.   Respiratory:  Positive for cough. Negative for shortness of breath.   Cardiovascular:  Negative for chest pain, palpitations and leg swelling.  Gastrointestinal:  Negative for abdominal pain, blood in stool, constipation, diarrhea, nausea and vomiting.  Genitourinary:  Negative for dysuria.  Musculoskeletal:  Positive for myalgias. Negative for falls.  Skin:  Negative for rash.  Neurological:  Negative for dizziness.  Psychiatric/Behavioral:  Negative for depression. The patient is not nervous/anxious.      Past Medical History:  Diagnosis Date   Hemifacial spasm    left    reports that she quit smoking about 33 years ago. Her smoking use included cigarettes. She has never used smokeless tobacco. She reports current alcohol use. She reports that she does not use drugs.   Current Outpatient Medications:    calcium-vitamin D (OSCAL WITH D) 500-200 MG-UNIT tablet, Take 1 tablet by mouth., Disp: , Rfl:    Coenzyme Q10 (COQ10 PO), Take 2 each by mouth daily. W/ turmeric, Disp: , Rfl:    Elderberry 575 MG/5ML SYRP, Take by mouth daily. , Disp: , Rfl:    Ginseng 100 MG CAPS, Take by mouth daily. , Disp: , Rfl:    Multiple Vitamin (MULTIVITAMIN) tablet, Take 1 tablet by mouth daily., Disp: , Rfl:    NON FORMULARY, Brain Energizer tablet once a day, Disp: , Rfl:    Omega-3 Fatty Acids (OMEGA 3 PO), Take by mouth., Disp: , Rfl:    Probiotic Product (PROBIOTIC DAILY PO), Take by mouth daily., Disp: , Rfl:    Observations/Objective:  Temperature 98.2 F (36.8 C), height 5' 2.6" (1.59 m), weight 166 lb (75.3 kg).  Physical Exam  Physical Exam Constitutional:      General: The patient is not in acute distress. Pulmonary:     Effort: Pulmonary effort is normal. No respiratory distress.  Neurological:     Mental Status: The patient is alert and oriented to person, place, and time.  Psychiatric:        Mood and Affect: Mood normal.        Behavior: Behavior normal.   Assessment and  Plan Problem List Items Addressed This Visit     COVID-19 virus infection - Primary    COVID19  Infection < 5 days from onset of symptoms in 2 x  vaccinated overweight individual with history of  Bradycardia and overweight  No clear sign of bacterial infection at this time.   No SOB.  No red flags/need for ER visit or in-person exam at respiratory clinic at this time..    Pt higher risk for COVID complications given  age and weight.  Start molnupiravir 5 day course. Reviewed course of medication and side effect profile with patient in detail.   Symptomatic care with mucinex and cough suppressant at night. If SOB begins symptoms worsening.. have low threshold for in-person exam, if severe shortness of breath ER visit recommended.  Can monitor Oxygen saturation at home with home monitor if able to obtain.  Go to ER if O2 sat < 90% on room air.   Reviewed home care and provided information through MyChart.  Recommended quarantine 5 days isolation recommended. Return to work day 6 and wear mask for 4 more days to complete 10 days. Provided info about prevention of spread of COVID 19.       Relevant Medications   molnupiravir EUA (LAGEVRIO) 200 mg CAPS capsule      I discussed the assessment and treatment plan with the patient. The patient was provided an opportunity to ask questions and all were answered. The patient agreed with the plan and demonstrated an understanding of the instructions.   The patient was advised to call back or seek an in-person evaluation if the symptoms worsen or if the condition fails to improve as anticipated.     Kerby Nora, MD

## 2021-11-24 NOTE — Assessment & Plan Note (Signed)
COVID19  Infection < 5 days from onset of symptoms in 2 x  vaccinated overweight individual with history of  Bradycardia and overweight  No clear sign of bacterial infection at this time.   No SOB.  No red flags/need for ER visit or in-person exam at respiratory clinic at this time..    Pt higher risk for COVID complications given  age and weight.  Start molnupiravir 5 day course. Reviewed course of medication and side effect profile with patient in detail.   Symptomatic care with mucinex and cough suppressant at night. If SOB begins symptoms worsening.. have low threshold for in-person exam, if severe shortness of breath ER visit recommended.  Can monitor Oxygen saturation at home with home monitor if able to obtain.  Go to ER if O2 sat < 90% on room air.   Reviewed home care and provided information through MyChart.  Recommended quarantine 5 days isolation recommended. Return to work day 6 and wear mask for 4 more days to complete 10 days. Provided info about prevention of spread of COVID 19.

## 2021-11-24 NOTE — Telephone Encounter (Signed)
Added

## 2021-11-24 NOTE — Telephone Encounter (Signed)
Pt's husband saw Dr. Ermalene Searing today via MyChart visit.  Pt mentioned to Dr. Ermalene Searing she also is COVID+.    Dr. Ermalene Searing agreed to see pt and asks that a MyChart visit be added to her schedule today at 2:20.  Plz add appt.  Pt is aware.

## 2021-12-09 ENCOUNTER — Encounter (HOSPITAL_COMMUNITY): Payer: Self-pay

## 2022-01-06 ENCOUNTER — Other Ambulatory Visit: Payer: Self-pay

## 2022-01-06 ENCOUNTER — Ambulatory Visit
Admission: RE | Admit: 2022-01-06 | Discharge: 2022-01-06 | Disposition: A | Discharge status: Home / Self Care | Payer: 59 | Source: Ambulatory Visit | Attending: Cardiology | Admitting: Cardiology

## 2022-01-06 DIAGNOSIS — I700 Atherosclerosis of aorta: Secondary | ICD-10-CM | POA: Insufficient documentation

## 2022-01-06 DIAGNOSIS — Z136 Encounter for screening for cardiovascular disorders: Secondary | ICD-10-CM | POA: Insufficient documentation

## 2022-01-08 ENCOUNTER — Encounter: Payer: Self-pay | Admitting: Family Medicine

## 2022-01-08 DIAGNOSIS — R931 Abnormal findings on diagnostic imaging of heart and coronary circulation: Secondary | ICD-10-CM

## 2022-01-08 DIAGNOSIS — E785 Hyperlipidemia, unspecified: Secondary | ICD-10-CM

## 2022-01-11 ENCOUNTER — Telehealth: Payer: Self-pay | Admitting: *Deleted

## 2022-01-11 MED ORDER — ATORVASTATIN CALCIUM 40 MG PO TABS
40.0000 mg | ORAL_TABLET | Freq: Every day | ORAL | 3 refills | Status: DC
Start: 2022-01-11 — End: 2022-08-12

## 2022-01-11 NOTE — Telephone Encounter (Signed)
The patient has been notified of the result and verbalized understanding.  All questions (if any) were answered. ?Sampson Goon, RN 01/11/2022 12:48 PM  ?  ?Patient's PCP already placed the orders for meds and referral . Atorvastatin 40 mg QD and referral to Dr. Val EagleJennette Kettle.  ?Patient verbalized understanding and agreement.  ?

## 2022-01-11 NOTE — Telephone Encounter (Signed)
-----   Message from Lanier Prude, MD sent at 01/09/2022 12:06 PM EDT ----- ?CT shows a large amount of calcium around the heart arteries (99th percentile). I would recommend we start Atorvastatin 40mg  by mouth once daily. I would also like you to establish with a general cardiologist. ? ? , can you call in the Atorvastatin and refer her to see Dr Inetta Fermo O'Neal? ? ?Meredith Staggers. Rossie Muskrat, MD, [facility], [facility] ?Cardiac Electrophysiology ? ? ?

## 2022-01-11 NOTE — Addendum Note (Signed)
Addended by: Waunita Schooner R on: 01/11/2022 11:52 AM ? ? Modules accepted: Orders ? ?

## 2022-01-24 NOTE — Progress Notes (Signed)
?Cardiology Office Note:   ?Date:  01/27/2022  ?NAME:  Betty Smith    ?MRN: [id number] ?DOB:  Aug 12, 1956  ? ?PCP:  Lynnda Child, MD  ?Cardiologist:  None  ?Electrophysiologist:  Lanier Prude, MD  ? ?Referring MD: Lynnda Child, MD  ? ?Chief Complaint  ?Patient presents with  ? Follow-up  ? ?History of Present Illness:   ?Betty Smith is a 66 y.o. female with a hx of coronary calcium who presents for follow-up.  She presents for the evaluation of elevated coronary calcium score.  Value severely elevated.  She reports that she has had intermittent dullness in her chest at night.  This is occurred for the past few months.  No symptoms recently.  She reports no exertional chest pain or pressure.  She informs me that she can exercise on elliptical or treadmill for 15 minutes without any major limitations.  Her blood pressure is elevated but she informed that she has whitecoat hypertension.  EKG demonstrates possible old anteroseptal infarct.  She is a former smoker of 20 years.  She smoked 1/2 pack/day.  She presents with her husband.  She currently works as a Conservation officer, nature.  She informed me that she had 12 children.  Most recent cholesterol shows total cholesterol 203, HDL 51, LDL 126, triglycerides 129.  Recent TSH is within limits.  Cardiovascular semination is normal.  She is starting to be more cognizant of her diet as well as exercise pattern since getting her calcium score.  Also noted to have dilation of the aortic root.  We discussed proceeding with a stress test to make sure she has no significant blockages.  We also discussed diet and exercise goals.  We also discussed her cholesterol goal.  She was started on Lipitor 40 mg daily by her primary care physician.  I do agree with this.  We need to get her LDL cholesterol much lower than it is.  No strong family history of heart disease.  She is a former smoker.  No alcohol or drug use is reported. ? ?Problem List ?CAC ?-Score 1383, 99th percentile ?2.  HLD ? ?Past Medical History: ?Past Medical History:  ?Diagnosis Date  ? Coronary artery disease   ? Hemifacial spasm   ? left  ? Hyperlipidemia   ? ? ?Past Surgical History: ?Past Surgical History:  ?Procedure Laterality Date  ? CESAREAN SECTION  1983, 1985  ? IR RADIOLOGIST EVAL & MGMT  09/25/2020  ? RECTAL PROLAPSE REPAIR    ? TONSILLECTOMY AND ADENOIDECTOMY    ? WISDOM TOOTH EXTRACTION    ? ? ?Current Medications: ?Current Meds  ?Medication Sig  ? Ascorbic Acid (VITAMIN C) 1000 MG tablet Take 1,000 mg by mouth daily.  ? atorvastatin (LIPITOR) 40 MG tablet Take 1 tablet (40 mg total) by mouth daily.  ? CHOLECALCIFEROL PO Take 5,000 Units by mouth daily.  ? Magnesium 400 MG CAPS Take 400 mg by mouth daily in the afternoon.  ? Multiple Vitamin (MULTIVITAMIN) tablet Take 1 tablet by mouth daily.  ? Omega-3 Fatty Acids (OMEGA 3 PO) Take by mouth daily in the afternoon.  ? Probiotic Product (PROBIOTIC DAILY PO) Take by mouth daily.  ?  ? ?Allergies:    ?Patient has no known allergies.  ? ?Social History: ?Social History  ? ?Socioeconomic History  ? Marital status: Married  ?  Spouse name: Betty Smith  ? Number of children: 12  ? Years of education: high school  ? Highest education level: Not on  file  ?Occupational History  ? 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  Comment: also house cleaning  ?Tobacco Use  ? Smoking status: Former  ?  Packs/day: 0.50  ?  Years: 20.00  ?  Pack years: 10.00  ?  Types: Cigarettes  ?  Quit date: 09/28/1988  ?  Years since quitting: 33.3  ? Smokeless tobacco: Never  ?Vaping Use  ? Vaping Use: Never used  ?Substance and Sexual Activity  ? Alcohol use: Yes  ?  Alcohol/week: 0.0 standard drinks  ?  Comment: a couple of times a month  ? Drug use: No  ? Sexual activity: Yes  ?  Birth control/protection: Post-menopausal  ?Other Topics Concern  ? Not on file  ?Social History Narrative  ? 08/12/20  ? [place]  ? Living: with Husband, Betty CooterHenry [phone])  ? Work: at the [employer]  ?   ? Family:  12 children, 13 grandchildren  ?   ? Enjoys: cleaning the house, make things, cook, walking  ?   ? Exercise: walking all the time  ? Diet: junk food, but tries to eat healthy - veggies/fruits/meats, limits pasta  ?   ? Safety  ? Seat belts: Yes   ? Guns: No  ? Safe in relationships: Yes   ? ?Social Determinants of Health  ? ?Financial Resource Strain: Not on file  ?Food Insecurity: Not on file  ?Transportation Needs: Not on file  ?Physical Activity: Not on file  ?Stress: Not on file  ?Social Connections: Not on file  ?  ? ?Family History: ?The patient's family history includes Arthritis in her mother; Breast cancer in her maternal grandmother; Esophageal cancer in her mother; Pancreatic cancer (age of onset: 5153) in her brother and father; Skin cancer in her paternal grandmother; Stroke in her brother. ? ?ROS:   ?All other ROS reviewed and negative. Pertinent positives noted in the HPI.    ? ?EKGs/Labs/Other Studies Reviewed:   ?The following studies were personally reviewed by me today: ? ?EKG:  EKG is ordered today.  The ekg ordered today demonstrates normal sinus rhythm heart rate 65, old anteroseptal infarct, and was personally reviewed by me.  ? ?Recent Labs: ?08/10/2021: ALT 17; BUN 12; Creatinine, Ser 0.66; Hemoglobin 14.1; Platelets 242.0; Potassium 4.1; Sodium 139; TSH 1.80  ? ?Recent Lipid Panel ?   ?Component Value Date/Time  ? CHOL 203 (H) 08/10/2021 0847  ? TRIG 129.0 08/10/2021 0847  ? HDL 51.20 08/10/2021 0847  ? CHOLHDL 4 08/10/2021 0847  ? VLDL 25.8 08/10/2021 0847  ? LDLCALC 126 (H) 08/10/2021 0847  ? ? ?Physical Exam:   ?VS:  BP (!) 150/86 (BP Location: Left Arm, Patient Position: Sitting, Cuff Size: Normal)   Pulse 65   Ht 5' 2.5" (1.588 m)   Wt 167 lb 9.6 oz (76 kg)   SpO2 99%   BMI 30.17 kg/m?    ?Wt Readings from Last 3 Encounters:  ?01/27/22 167 lb 9.6 oz (76 kg)  ?11/24/21 166 lb (75.3 kg)  ?09/09/21 178 lb (80.7 kg)  ?  ?General: Well nourished, well developed, in no acute  distress ?Head: Atraumatic, normal size  ?Eyes: PEERLA, EOMI  ?Neck: Supple, no JVD ?Endocrine: No thryomegaly ?Cardiac: Normal S1, S2; RRR; no murmurs, rubs, or gallops ?Lungs: Clear to auscultation bilaterally, no wheezing, rhonchi or rales  ?Abd: Soft, nontender, no hepatomegaly  ?Ext: No edema, pulses 2+ ?Musculoskeletal: No deformities, BUE and BLE strength normal and equal ?Skin: Warm  and dry, no rashes   ?Neuro: Alert and oriented to person, place, time, and situation, CNII-XII grossly intact, no focal deficits  ?Psych: Normal mood and affect  ? ?ASSESSMENT:   ?Kawthar Ennen is a 66 y.o. female who presents for the following: ?1. Coronary artery disease involving native coronary artery of native heart without angina pectoris   ?2. Agatston coronary artery calcium score greater than 400   ?3. Chest pain of uncertain etiology   ?4. Hyperlipidemia, unspecified hyperlipidemia type   ? ? ?PLAN:   ?1. Coronary artery disease involving native coronary artery of native heart without angina pectoris ?2. Agatston coronary artery calcium score greater than 400 ?3. Chest pain of uncertain etiology ?-Elevated coronary calcium score above 1300.  Reports intermittent episodes of dullness in her chest.  Given her elevated calcium scoring I recommended a cardiac PET stress test.  I believe this is the best thing to rule out any epicardial stenoses as well as small vessel disease.  She is exercising and seems to have resolved her symptoms.  I think it is best to evaluate this further given the high risk calcium score that she has.  I would also like to obtain an echocardiogram.  Her EKG demonstrates an old anteroseptal infarct which is likely breast artifact.  We will make sure with an echo.  Also there is mention of a dilated aortic root.  Echo will evaluate this.  For treatment she should continue aspirin 81 mg daily.  She should also continue Lipitor 40 mg daily.  Need to get her LDL cholesterol much lower than it is.  LDL  goal is less than 50.  We also discussed exercise as well as dietary goals.  She will work on both of these.  She will see me back in 4 months with a fasting lipid 1 week before that appointment. ? ?4. Hyperlipidemia, unspeci

## 2022-01-27 ENCOUNTER — Ambulatory Visit (INDEPENDENT_AMBULATORY_CARE_PROVIDER_SITE_OTHER): Payer: 59 | Admitting: Cardiovascular Disease

## 2022-01-27 ENCOUNTER — Encounter: Payer: Self-pay | Admitting: Cardiovascular Disease

## 2022-01-27 VITALS — BP 150/86 | HR 65 | Ht 62.5 in | Wt 167.6 lb

## 2022-01-27 DIAGNOSIS — I2510 Atherosclerotic heart disease of native coronary artery without angina pectoris: Secondary | ICD-10-CM | POA: Diagnosis not present

## 2022-01-27 DIAGNOSIS — R079 Chest pain, unspecified: Secondary | ICD-10-CM | POA: Diagnosis not present

## 2022-01-27 DIAGNOSIS — R931 Abnormal findings on diagnostic imaging of heart and coronary circulation: Secondary | ICD-10-CM

## 2022-01-27 DIAGNOSIS — E785 Hyperlipidemia, unspecified: Secondary | ICD-10-CM | POA: Diagnosis not present

## 2022-01-27 NOTE — Patient Instructions (Signed)
Medication Instructions:  ?The current medical regimen is effective;  continue present plan and medications. ? ?*If you need a refill on your cardiac medications before your next appointment, please call your pharmacy* ? ? ?Lab Work: ?LIPID (1 week before appointment in 4 months, no lab appointment needed, come fasting- nothing to eat or drink) ? ?If you have labs (blood work) drawn today and your tests are completely normal, you will receive your results only by: ?MyChart Message (if you have MyChart) OR ?A paper copy in the mail ?If you have any lab test that is abnormal or we need to change your treatment, we will call you to review the results. ? ? ?Testing/Procedures: ?CARDIAC PET ? ?Echocardiogram - Your physician has requested that you have an echocardiogram. Echocardiography is a painless test that uses sound waves to create images of your heart. It provides your doctor with information about the size and shape of your heart and how well your heart?s chambers and valves are working. This procedure takes approximately one hour. There are no restrictions for this procedure. To be completed [facility]  ? ? ? ?Follow-Up: ?At [facility], you and your health needs are our priority.  As part of our continuing mission to provide you with exceptional heart care, we have created designated Provider Care Teams.  These Care Teams include your primary Cardiologist (physician) and Advanced Practice Providers (APPs -  Physician Assistants and Nurse Practitioners) who all work together to provide you with the care you need, when you need it. ? ?We recommend signing up for the patient portal called "MyChart".  Sign up information is provided on this After Visit Summary.  MyChart is used to connect with patients for Virtual Visits (Telemedicine).  Patients are able to view lab/test results, encounter notes, upcoming appointments, etc.  Non-urgent messages can be sent to your provider as well.   ?To learn more about what you  can do with MyChart, go to [email or web address].   ? ?Your next appointment:   ?4 month(s) ? ?The format for your next appointment:   ?In Person ? ?Provider:   ?Lennie Odor, MD  ? ? ?Other Instructions ? ?How to Prepare for Your Cardiac PET/CT Stress Test: ? ?1. Please do not take these medications before your test:  ? ?Medications that may interfere with the cardiac pharmacological stress agent (ex. nitrates or beta-blockers) the day of the exam. ?Theophylline containing medications for 12 hours. ?Dipyridamole 48 hours prior to the test. ?Your remaining medications may be taken with water. ? ?2. Nothing to eat or drink, except water, 3 hours prior to arrival time.   ?NO caffeine/decaffeinated products, or chocolate 12 hours prior to arrival. ? ?3. NO perfume, cologne or lotion ? ?4. Total time is 1 to 2 hours; you may want to bring reading material for the waiting time. ? ?5. Please report to Admitting at the [facility] Main Entrance 60 minutes early for your test. ? [address] [place] ? [place], [place] [address] ? ?Diabetic Preparation:  ?Hold oral medications. ?You may take NPH and Lantus insulin. ?Do not take Humalog or Humulin R (Regular Insulin) the day of your test. ?Check blood sugars prior to leaving the house. ?If able to eat breakfast prior to 3 hour fasting, you may take all medications, including your insulin, ?Do not worry if you miss your breakfast dose of insulin - start at your next meal. ? ?IF YOU THINK YOU MAY BE PREGNANT, OR ARE NURSING PLEASE INFORM THE TECHNOLOGIST. ? ?  In preparation for your appointment, medication and supplies will be purchased.  Appointment availability is limited, so if you need to cancel or reschedule, please call the Radiology Department at [phone]  24 hours in advance to avoid a cancellation fee of $100.00 ? ?What to Expect After you Arrive: ? ?Once you arrive and check in for your appointment, you will be taken to a preparation room within the  Radiology Department.  A technologist or Nurse will obtain your medical history, verify that you are correctly prepped for the exam, and explain the procedure.  Afterwards,  an IV will be started in your arm and electrodes will be placed on your skin for EKG monitoring during the stress portion of the exam. Then you will be escorted to the PET/CT scanner.  There, staff will get you positioned on the scanner and obtain a blood pressure and EKG.  During the exam, you will continue to be connected to the EKG and blood pressure machines.  A small, safe amount of a radioactive tracer will be injected in your IV to obtain a series of pictures of your heart along with an injection of a stress agent.   ? ?After your Exam: ? ?It is recommended that you eat a meal and drink a caffeinated beverage to counter act any effects of the stress agent.  Drink plenty of fluids for the remainder of the day and urinate frequently for the first couple of hours after the exam.  Your doctor will inform you of your test results within 7-10 business days. ? ?For questions about your test or how to prepare for your test, please call: ?Rockwell Alexandria, Cardiac Imaging Nurse Navigator  ?Larey Brick, Cardiac Imaging Nurse Navigator ?Office: [phone] ? ? ?Important Information About Sugar ? ? ? ? ? ? ?

## 2022-01-28 ENCOUNTER — Other Ambulatory Visit (INDEPENDENT_AMBULATORY_CARE_PROVIDER_SITE_OTHER): Payer: 59

## 2022-01-28 DIAGNOSIS — R079 Chest pain, unspecified: Secondary | ICD-10-CM

## 2022-02-17 ENCOUNTER — Ambulatory Visit (INDEPENDENT_AMBULATORY_CARE_PROVIDER_SITE_OTHER): Payer: 59

## 2022-02-17 DIAGNOSIS — I2510 Atherosclerotic heart disease of native coronary artery without angina pectoris: Secondary | ICD-10-CM | POA: Diagnosis not present

## 2022-02-17 LAB — ECHOCARDIOGRAM COMPLETE
AR max vel: 2.14 cm2
AV Area VTI: 2.32 cm2
AV Area mean vel: 2.24 cm2
AV Mean grad: 9 mmHg
AV Peak grad: 15.2 mmHg
Ao pk vel: 1.95 m/s
Area-P 1/2: 2.52 cm2
Calc EF: 62.4 %
MV M vel: 4.93 m/s
MV Peak grad: 97.2 mmHg
S' Lateral: 2.4 cm
Single Plane A2C EF: 63.8 %
Single Plane A4C EF: 62.9 %

## 2022-02-17 MED ORDER — PERFLUTREN LIPID MICROSPHERE
1.0000 mL | INTRAVENOUS | Status: AC | PRN
  Administered 2022-02-17: 2 mL via INTRAVENOUS

## 2022-02-19 ENCOUNTER — Encounter: Payer: Self-pay | Admitting: Cardiovascular Disease

## 2022-03-29 ENCOUNTER — Telehealth (HOSPITAL_COMMUNITY): Payer: Self-pay | Admitting: Emergency Medicine

## 2022-03-29 NOTE — Telephone Encounter (Signed)
Attempted to call patient regarding upcoming cardiac PET appointment. Left message on voicemail with name and callback number Corona Popovich RN Navigator Cardiac Imaging [facility] [phone] Office [phone] Cell  

## 2022-03-30 ENCOUNTER — Encounter (HOSPITAL_COMMUNITY): Payer: Self-pay

## 2022-03-30 ENCOUNTER — Encounter (HOSPITAL_COMMUNITY)
Admission: RE | Admit: 2022-03-30 | Discharge: 2022-03-30 | Disposition: A | Discharge status: Home / Self Care | Payer: 59 | Source: Ambulatory Visit | Attending: Cardiovascular Disease | Admitting: Cardiovascular Disease

## 2022-03-30 DIAGNOSIS — R079 Chest pain, unspecified: Secondary | ICD-10-CM

## 2022-03-30 LAB — NM PET CT CARDIAC PERFUSION MULTI W/ABSOLUTE BLOODFLOW
MBFR: 3.62
Nuc Rest EF: 60 %
Nuc Stress EF: 72 %
Rest MBF: 1.02 ml/g/min
Rest Nuclear Isotope Dose: 19.8 mCi
ST Depression (mm): 0 mm
Stress MBF: 3.69 ml/g/min
Stress Nuclear Isotope Dose: 19.7 mCi

## 2022-03-30 MED ORDER — RUBIDIUM RB82 GENERATOR (RUBYFILL)
19.7000 | PACK | Freq: Once | INTRAVENOUS | Status: AC
  Administered 2022-03-30: 19.8 via INTRAVENOUS

## 2022-03-30 MED ORDER — REGADENOSON 0.4 MG/5ML IV SOLN
INTRAVENOUS | Status: AC
  Administered 2022-03-30: 0.4 mg via INTRAVENOUS
  Filled 2022-03-30: qty 5

## 2022-03-30 MED ORDER — RUBIDIUM RB82 GENERATOR (RUBYFILL)
19.7000 | PACK | Freq: Once | INTRAVENOUS | Status: AC
  Administered 2022-03-30: 19.7 via INTRAVENOUS

## 2022-04-02 ENCOUNTER — Encounter: Payer: Self-pay | Admitting: Cardiovascular Disease

## 2022-06-10 LAB — LIPID PANEL
Chol/HDL Ratio: 3 ratio (ref 0.0–4.4)
Cholesterol, Total: 183 mg/dL (ref 100–199)
HDL: 61 mg/dL (ref >39)
LDL Chol Calc (NIH): 108 mg/dL — ABNORMAL HIGH (ref 0–99)
Triglycerides: 74 mg/dL (ref 0–149)
VLDL Cholesterol Cal: 14 mg/dL (ref 5–40)

## 2022-06-15 ENCOUNTER — Telehealth: Payer: Self-pay

## 2022-06-15 NOTE — Telephone Encounter (Signed)
Called patient in regards to her appointment for tomorrow. Advised her to call back to discuss.  Left call back number.

## 2022-06-15 NOTE — Telephone Encounter (Signed)
Called patient, switched to 9:00 AM.   Patient made aware of when to arrive. Verbalized understanding. Thankful for call back.

## 2022-06-15 NOTE — Telephone Encounter (Signed)
Pt is returning call. Requesting call back. She says she can change appt to 9:00 a.m. 09/06. Still requesting call.

## 2022-06-16 ENCOUNTER — Encounter: Payer: Self-pay | Admitting: Cardiovascular Disease

## 2022-06-16 ENCOUNTER — Ambulatory Visit: Payer: 59 | Attending: Cardiovascular Disease | Admitting: Cardiovascular Disease

## 2022-06-16 VITALS — BP 138/84 | HR 67 | Ht 62.5 in | Wt 171.2 lb

## 2022-06-16 DIAGNOSIS — E785 Hyperlipidemia, unspecified: Secondary | ICD-10-CM | POA: Diagnosis not present

## 2022-06-16 DIAGNOSIS — I2510 Atherosclerotic heart disease of native coronary artery without angina pectoris: Secondary | ICD-10-CM | POA: Diagnosis not present

## 2022-06-16 DIAGNOSIS — R931 Abnormal findings on diagnostic imaging of heart and coronary circulation: Secondary | ICD-10-CM

## 2022-06-16 DIAGNOSIS — I77819 Aortic ectasia, unspecified site: Secondary | ICD-10-CM

## 2022-06-16 DIAGNOSIS — I340 Nonrheumatic mitral (valve) insufficiency: Secondary | ICD-10-CM

## 2022-06-16 DIAGNOSIS — I77810 Thoracic aortic ectasia: Secondary | ICD-10-CM

## 2022-06-16 NOTE — Progress Notes (Signed)
Cardiology Office Note:   Date:  06/16/2022  NAME:  Betty Smith    MRN: [id number] DOB:  1956/09/28   PCP:  Lynnda Child, MD  Cardiologist:  None  Electrophysiologist:  Lanier Prude, MD   Referring MD: Lynnda Child, MD   Chief Complaint  Patient presents with   Follow-up         History of Present Illness:   Betty Smith is a 66 y.o. female with a hx of CAD, hyperlipidemia who presents for follow-up.  Had elevated calcium score.  Underwent cardiac PET perfusion study which showed small area of ischemia but normal myocardial blood flow reserve.  Suspect this is just artifactual. She reports no chest pain.  Symptoms have occurred with stress.  Suspect a lot of her chest pain symptoms are stress related.  Her PET perfusion imaging study was quite normal.  She reports she stopped the Lipitor 1 month ago.  She would like to do things naturally.  Her most recent LDL cholesterol is 108.  We discussed that my recommendation would be to take Lipitor but she would like to think about this.  This is fine.  She is on aspirin.  Her blood pressure is well controlled.  CV exam is normal.  Echo showed mild to moderate mitral valve regurgitation as well as some dilation of her ascending aorta.  This will need to be followed.  Suspect her MR was overestimated.  Problem List CAC -Score 1383, 99th percentile 2. HLD -T chol 183, HDL 61, LDL 108  Past Medical History: Past Medical History:  Diagnosis Date   Coronary artery disease    Hemifacial spasm    left   Hyperlipidemia     Past Surgical History: Past Surgical History:  Procedure Laterality Date   CESAREAN SECTION  1983, 1985   IR RADIOLOGIST EVAL & MGMT  09/25/2020   RECTAL PROLAPSE REPAIR     TONSILLECTOMY AND ADENOIDECTOMY     WISDOM TOOTH EXTRACTION      Current Medications: Current Meds  Medication Sig   Ascorbic Acid (VITAMIN C) 1000 MG tablet Take 1,000 mg by mouth daily.   atorvastatin (LIPITOR) 40 MG tablet Take  1 tablet (40 mg total) by mouth daily.   calcium-vitamin D (OSCAL WITH D) 500-200 MG-UNIT tablet Take 1 tablet by mouth.   CHOLECALCIFEROL PO Take 5,000 Units by mouth daily.   Coenzyme Q10 (COQ10 PO) Take 2 each by mouth daily. W/ turmeric   Elderberry 575 MG/5ML SYRP Take by mouth daily.   Magnesium 400 MG CAPS Take 400 mg by mouth daily in the afternoon.   Multiple Vitamin (MULTIVITAMIN) tablet Take 1 tablet by mouth daily.   NON FORMULARY Brain Energizer tablet once a day   Omega-3 Fatty Acids (OMEGA 3 PO) Take by mouth daily in the afternoon.   Probiotic Product (PROBIOTIC DAILY PO) Take by mouth daily.     Allergies:    Patient has no known allergies.   Social History: Social History   Socioeconomic History   Marital status: Married    Spouse name: Betty Smith   Number of children: 12   Years of education: high school   Highest education level: Not on file  Occupational History   Occupation: farmers market    Comment: also house cleaning  Tobacco Use   Smoking status: Former    Packs/day: 0.50    Years: 20.00    Total pack years: 10.00    Types: Cigarettes  Quit date: 09/28/1988    Years since quitting: 33.7   Smokeless tobacco: Never  Vaping Use   Vaping Use: Never used  Substance and Sexual Activity   Alcohol use: Yes    Alcohol/week: 0.0 standard drinks of alcohol    Comment: a couple of times a month   Drug use: No   Sexual activity: Yes    Birth control/protection: Post-menopausal  Other Topics Concern   Not on file  Social History Narrative   08/12/20   From: [place], moved to [facility] 2008   Living: with Husband, Betty Smith (1979)   Work: at the [employer]      Family: 12 children, 13 grandchildren      Enjoys: cleaning the house, make things, cook, walking      Exercise: walking all the time   Diet: junk food, but tries to eat healthy - veggies/fruits/meats, limits pasta      Safety   Seat belts: Yes    Guns: No   Safe in relationships: Yes    Social  Determinants of Corporate investment banker Strain: Not on file  Food Insecurity: Not on file  Transportation Needs: Not on file  Physical Activity: Not on file  Stress: Not on file  Social Connections: Not on file     Family History: The patient's family history includes Arthritis in her mother; Breast cancer in her maternal grandmother; Esophageal cancer in her mother; Pancreatic cancer (age of onset: 66) in her brother and father; Skin cancer in her paternal grandmother; Stroke in her brother.  ROS:   All other ROS reviewed and negative. Pertinent positives noted in the HPI.     EKGs/Labs/Other Studies Reviewed:   The following studies were personally reviewed by me today:   PET MPI 03/30/2022   Findings are consistent with ischemia. Small reversible perfusion defect at apex/apical inferior wall suggesting distal LAD territory ischemia.  While there is normal global and LAD territory myocardial blood flow reserve, this is likely due to area of ischemia being small. The study is low risk.   LV perfusion is abnormal. There is evidence of ischemia. Defect 1: There is a small defect with moderate reduction in uptake present in the apical inferior and apex location(s) that is reversible. There is abnormal wall motion in the defect area. Consistent with ischemia.   Rest left ventricular function is normal. Rest EF: 60 %. Stress left ventricular function is normal. Stress EF: 72 %. End diastolic cavity size is normal. End systolic cavity size is normal.   Myocardial blood flow was computed to be 1.47ml/g/min at rest and 3.52ml/g/min at stress. Global myocardial blood flow reserve was 3.62 and was normal.   Coronary calcium was present on the attenuation correction CT images. Severe coronary calcifications were present. Coronary calcifications were present in the left anterior descending artery, left circumflex artery and right coronary artery distribution(s).   Electronically signed by  Epifanio Lesches, MD  TTE 02/17/2022  1. Left ventricular ejection fraction, by estimation, is 60 to 65%. Left  ventricular ejection fraction by 2D MOD biplane is 62.4 %. The left  ventricle has normal function. The left ventricle has no regional wall  motion abnormalities. Left ventricular  diastolic parameters are consistent with Grade I diastolic dysfunction  (impaired relaxation).   2. Right ventricular systolic function is normal. The right ventricular  size is normal.   3. The mitral valve is normal in structure. Moderate mitral valve  regurgitation.   4. The aortic  valve was not well visualized. Aortic valve regurgitation  is not visualized.   5. Aortic dilatation noted. There is mild dilatation of the ascending  aorta, measuring 40 mm.   6. The inferior vena cava is normal in size with greater than 50%  respiratory variability, suggesting right atrial pressure of 3 mmHg.   Recent Labs: 08/10/2021: ALT 17; BUN 12; Creatinine, Ser 0.66; Hemoglobin 14.1; Platelets 242.0; Potassium 4.1; Sodium 139; TSH 1.80   Recent Lipid Panel    Component Value Date/Time   CHOL 183 06/09/2022 0853   TRIG 74 06/09/2022 0853   HDL 61 06/09/2022 0853   CHOLHDL 3.0 06/09/2022 0853   CHOLHDL 4 08/10/2021 0847   VLDL 25.8 08/10/2021 0847   LDLCALC 108 (H) 06/09/2022 0853    Physical Exam:   VS:  BP 138/84   Pulse 67   Ht 5' 2.5" (1.588 m)   Wt 171 lb 3.2 oz (77.7 kg)   SpO2 97%   BMI 30.81 kg/m    Wt Readings from Last 3 Encounters:  06/16/22 171 lb 3.2 oz (77.7 kg)  01/27/22 167 lb 9.6 oz (76 kg)  11/24/21 166 lb (75.3 kg)    General: Well nourished, well developed, in no acute distress Head: Atraumatic, normal size  Eyes: PEERLA, EOMI  Neck: Supple, no JVD Endocrine: No thryomegaly Cardiac: Normal S1, S2; RRR; no murmurs, rubs, or gallops Lungs: Clear to auscultation bilaterally, no wheezing, rhonchi or rales  Abd: Soft, nontender, no hepatomegaly  Ext: No edema, pulses  2+ Musculoskeletal: No deformities, BUE and BLE strength normal and equal Skin: Warm and dry, no rashes   Neuro: Alert and oriented to person, place, time, and situation, CNII-XII grossly intact, no focal deficits  Psych: Normal mood and affect   ASSESSMENT:   Kimani Bedoya is a 66 y.o. female who presents for the following: 1. Coronary artery disease involving native coronary artery of native heart without angina pectoris   2. Agatston coronary artery calcium score greater than 400   3. Hyperlipidemia, unspecified hyperlipidemia type   4. Nonrheumatic mitral valve regurgitation   5. Acquired dilation of ascending aorta and aortic root (HCC)     PLAN:   1. Coronary artery disease involving native coronary artery of native heart without angina pectoris 2. Agatston coronary artery calcium score greater than 400 3. Hyperlipidemia, unspecified hyperlipidemia type -Coronary calcium score 1383 which is 99th percentile.  Normal PET perfusion study.  LDL cholesterol 108.  We discussed statin therapy.  She reports she stopped this.  She would like to think about this before pursuing this.  We discussed the benefits of statin.  For now we will go with a conservative approach.  She is on aspirin 81 mg daily.  She denies any further chest pain symptoms.  Suspect this was stress related.  She will see me back in 1 year.  4. Nonrheumatic mitral valve regurgitation -Mild on recent echo.  Recheck in 1 year.  5. Acquired dilation of ascending aorta and aortic root (HCC) -Aorta on CT 41 mm.  40 on echo.  Recheck echo in 1 year.  Suspect this is just normal for her age.  Disposition: Return in about 1 year (around 06/17/2023).  Medication Adjustments/Labs and Tests Ordered: Current medicines are reviewed at length with the patient today.  Concerns regarding medicines are outlined above.  Orders Placed This Encounter  Procedures   ECHOCARDIOGRAM COMPLETE   No orders of the defined types were placed in  this encounter.  Patient Instructions  Medication Instructions:  The current medical regimen is effective;  continue present plan and medications.  *If you need a refill on your cardiac medications before your next appointment, please call your pharmacy*   Testing/Procedures: Echocardiogram (12 month) - Your physician has requested that you have an echocardiogram. Echocardiography is a painless test that uses sound waves to create images of your heart. It provides your doctor with information about the size and shape of your heart and how well your heart's chambers and valves are working. This procedure takes approximately one hour. There are no restrictions for this procedure.     Follow-Up: At [facility], you and your health needs are our priority.  As part of our continuing mission to provide you with exceptional heart care, we have created designated Provider Care Teams.  These Care Teams include your primary Cardiologist (physician) and Advanced Practice Providers (APPs -  Physician Assistants and Nurse Practitioners) who all work together to provide you with the care you need, when you need it.  We recommend signing up for the patient portal called "MyChart".  Sign up information is provided on this After Visit Summary.  MyChart is used to connect with patients for Virtual Visits (Telemedicine).  Patients are able to view lab/test results, encounter notes, upcoming appointments, etc.  Non-urgent messages can be sent to your provider as well.   To learn more about what you can do with MyChart, go to [email or web address].    Your next appointment:   12 month(s) ECHO before  The format for your next appointment:   In Person  Provider:   Lennie Odor, MD          Time Spent with Patient: I have spent a total of 25 minutes with patient reviewing hospital notes, telemetry, EKGs, labs and examining the patient as well as establishing an assessment and plan that was  discussed with the patient.  > 50% of time was spent in direct patient care.  Signed, Lenna Gilford. Flora Lipps, MD, [facility]  [facility]  [address], [address] [place], [place] [address] [phone]  06/16/2022 9:38 AM

## 2022-06-16 NOTE — Patient Instructions (Addendum)
Medication Instructions:  The current medical regimen is effective;  continue present plan and medications.  *If you need a refill on your cardiac medications before your next appointment, please call your pharmacy*   Testing/Procedures: Echocardiogram (12 month) - Your physician has requested that you have an echocardiogram. Echocardiography is a painless test that uses sound waves to create images of your heart. It provides your doctor with information about the size and shape of your heart and how well your heart's chambers and valves are working. This procedure takes approximately one hour. There are no restrictions for this procedure.     Follow-Up: At [facility], you and your health needs are our priority.  As part of our continuing mission to provide you with exceptional heart care, we have created designated Provider Care Teams.  These Care Teams include your primary Cardiologist (physician) and Advanced Practice Providers (APPs -  Physician Assistants and Nurse Practitioners) who all work together to provide you with the care you need, when you need it.  We recommend signing up for the patient portal called "MyChart".  Sign up information is provided on this After Visit Summary.  MyChart is used to connect with patients for Virtual Visits (Telemedicine).  Patients are able to view lab/test results, encounter notes, upcoming appointments, etc.  Non-urgent messages can be sent to your provider as well.   To learn more about what you can do with MyChart, go to [email or web address].    Your next appointment:   12 month(s) ECHO before  The format for your next appointment:   In Person  Provider:   Lennie Odor, MD

## 2022-06-23 ENCOUNTER — Encounter: Payer: 59 | Admitting: Family Medicine

## 2022-07-29 ENCOUNTER — Telehealth: Payer: Self-pay | Admitting: Family Medicine

## 2022-07-29 DIAGNOSIS — E785 Hyperlipidemia, unspecified: Secondary | ICD-10-CM

## 2022-07-29 NOTE — Telephone Encounter (Signed)
-----   Message from Ellamae Sia sent at 07/29/2022  9:57 AM EDT ----- Regarding: Lab orders for Thursday, 10.26.23 Patient is scheduled for CPX labs, please order future labs, Thanks , Karna Christmas

## 2022-08-05 ENCOUNTER — Other Ambulatory Visit (INDEPENDENT_AMBULATORY_CARE_PROVIDER_SITE_OTHER): Payer: 59

## 2022-08-05 DIAGNOSIS — E785 Hyperlipidemia, unspecified: Secondary | ICD-10-CM | POA: Diagnosis not present

## 2022-08-05 LAB — COMPREHENSIVE METABOLIC PANEL
ALT: 21 U/L (ref 0–35)
AST: 22 U/L (ref 0–37)
Albumin: 4.2 g/dL (ref 3.5–5.2)
Alkaline Phosphatase: 77 U/L (ref 39–117)
BUN: 16 mg/dL (ref 6–23)
CO2: 30 mEq/L (ref 19–32)
Calcium: 9.1 mg/dL (ref 8.4–10.5)
Chloride: 102 mEq/L (ref 96–112)
Creatinine, Ser: 0.69 mg/dL (ref 0.40–1.20)
GFR: 91.55 mL/min (ref >60.00)
Glucose, Bld: 100 mg/dL — ABNORMAL HIGH (ref 70–99)
Potassium: 4.2 mEq/L (ref 3.5–5.1)
Sodium: 137 mEq/L (ref 135–145)
Total Bilirubin: 0.6 mg/dL (ref 0.2–1.2)
Total Protein: 6.4 g/dL (ref 6.0–8.3)

## 2022-08-05 LAB — LIPID PANEL
Cholesterol: 165 mg/dL (ref 0–200)
HDL: 52.1 mg/dL (ref >39.00)
LDL Cholesterol: 100 mg/dL — ABNORMAL HIGH (ref 0–99)
NonHDL: 113.14
Total CHOL/HDL Ratio: 3
Triglycerides: 66 mg/dL (ref 0.0–149.0)
VLDL: 13.2 mg/dL (ref 0.0–40.0)

## 2022-08-05 NOTE — Progress Notes (Signed)
No critical labs need to be addressed urgently. We will discuss labs in detail at upcoming office visit.   

## 2022-08-12 ENCOUNTER — Ambulatory Visit (INDEPENDENT_AMBULATORY_CARE_PROVIDER_SITE_OTHER): Payer: 59 | Admitting: Family Medicine

## 2022-08-12 ENCOUNTER — Encounter: Payer: Self-pay | Admitting: Family Medicine

## 2022-08-12 VITALS — BP 140/80 | HR 63 | Temp 97.5°F | Ht 62.5 in | Wt 170.4 lb

## 2022-08-12 DIAGNOSIS — R001 Bradycardia, unspecified: Secondary | ICD-10-CM | POA: Diagnosis not present

## 2022-08-12 DIAGNOSIS — N8110 Cystocele, unspecified: Secondary | ICD-10-CM

## 2022-08-12 DIAGNOSIS — Z0000 Encounter for general adult medical examination without abnormal findings: Secondary | ICD-10-CM | POA: Diagnosis not present

## 2022-08-12 DIAGNOSIS — G5132 Clonic hemifacial spasm, left: Secondary | ICD-10-CM

## 2022-08-12 DIAGNOSIS — R079 Chest pain, unspecified: Secondary | ICD-10-CM

## 2022-08-12 DIAGNOSIS — E785 Hyperlipidemia, unspecified: Secondary | ICD-10-CM

## 2022-08-12 NOTE — Progress Notes (Signed)
Patient ID: Betty Smith, female    DOB: 1956/04/27, 66 y.o.   MRN: [id number]  This visit was conducted in person.  BP (!) 140/80   Pulse 63   Temp (!) 97.5 F (36.4 C) (Temporal)   Ht 5' 2.5" (1.588 m)   Wt 170 lb 6 oz (77.3 kg)   SpO2 96%   BMI 30.67 kg/m    CC:  Chief Complaint  Patient presents with   Transitions Of Care   Annual Exam    Subjective:   HPI: Betty Smith is a 66 y.o. female presenting on 08/12/2022 for Transitions Of Care and Annual Exam  Previous PCP Dr. Einar Pheasant Last physical August 10, 2021  Recent notes from PCP reviewed in detail.     Reviewed labs in detail with patient.  Elevated Cholesterol:  Lab Results  Component Value Date   CHOL 165 08/05/2022   HDL 52.10 08/05/2022   LDLCALC 100 (H) 08/05/2022   TRIG 66.0 08/05/2022   CHOLHDL 3 08/05/2022  Using medications without problems: Muscle aches:  Diet compliance: heart healthy Exercise:5 days a week elliptical rowing, bike , weight training. Also Yoga Other complaints: The 10-year ASCVD risk score (Arnett DK, et al., 2019) is: 5.5%   Values used to calculate the score:     Age: 33 years     Sex: Female     Is Non-Hispanic African American: No     Diabetic: No     Tobacco smoker: No     Systolic Blood Pressure: 010 mmHg     Is BP treated: No     HDL Cholesterol: 52.1 mg/dL     Total Cholesterol: 165 mg/dL    Relevant past medical, surgical, family and social history reviewed and updated as indicated. Interim medical history since our last visit reviewed. Allergies and medications reviewed and updated. Outpatient Medications Prior to Visit  Medication Sig Dispense Refill   Alpha-Lipoic Acid 600 MG CAPS Take by mouth.     ASHWAGANDHA PO Take 1,400 mg by mouth daily at 12 noon.     aspirin EC 81 MG tablet Take 81 mg by mouth daily. Swallow whole.     BIOTIN PO Take 600 mcg by mouth daily.     calcium-vitamin D (OSCAL WITH D) 500-200 MG-UNIT tablet Take 1 tablet by mouth.      CHOLECALCIFEROL PO Take 5,000 Units by mouth daily.     Coenzyme Q10 (COQ10 PO) Take 2 each by mouth daily. W/ turmeric     Elderberry 575 MG/5ML SYRP Take by mouth daily.     Magnesium 400 MG CAPS Take 400 mg by mouth daily in the afternoon.     Multiple Vitamin (MULTIVITAMIN) tablet Take 1 tablet by mouth daily.     NON FORMULARY Brain Energizer tablet once a day     NON FORMULARY Herbal Resistance Liquid     Omega-3 Fatty Acids (OMEGA 3 PO) Take by mouth daily in the afternoon.     Probiotic Product (PROBIOTIC DAILY PO) Take by mouth daily.     Ascorbic Acid (VITAMIN C) 1000 MG tablet Take 1,000 mg by mouth daily.     atorvastatin (LIPITOR) 40 MG tablet Take 1 tablet (40 mg total) by mouth daily. 90 tablet 3   No facility-administered medications prior to visit.     Per HPI unless specifically indicated in ROS section below Review of Systems  Constitutional:  Negative for fatigue and fever.  HENT:  Negative for congestion.  Eyes:  Negative for pain.  Respiratory:  Negative for cough and shortness of breath.   Cardiovascular:  Negative for chest pain, palpitations and leg swelling.  Gastrointestinal:  Negative for abdominal pain.  Genitourinary:  Negative for dysuria and vaginal bleeding.  Musculoskeletal:  Negative for back pain.  Neurological:  Negative for syncope, light-headedness and headaches.  Psychiatric/Behavioral:  Negative for dysphoric mood.    Objective:  BP (!) 140/80   Pulse 63   Temp (!) 97.5 F (36.4 C) (Temporal)   Ht 5' 2.5" (1.588 m)   Wt 170 lb 6 oz (77.3 kg)   SpO2 96%   BMI 30.67 kg/m   Wt Readings from Last 3 Encounters:  08/12/22 170 lb 6 oz (77.3 kg)  06/16/22 171 lb 3.2 oz (77.7 kg)  01/27/22 167 lb 9.6 oz (76 kg)      Physical Exam Vitals and nursing note reviewed.  Constitutional:      General: She is not in acute distress.    Appearance: Normal appearance. She is well-developed. She is not ill-appearing or toxic-appearing.  HENT:      Head: Normocephalic.     Right Ear: Hearing, tympanic membrane, ear canal and external ear normal.     Left Ear: Hearing, tympanic membrane, ear canal and external ear normal.     Nose: Nose normal.  Eyes:     General: Lids are normal. Lids are everted, no foreign bodies appreciated.     Conjunctiva/sclera: Conjunctivae normal.     Pupils: Pupils are equal, round, and reactive to light.  Neck:     Thyroid: No thyroid mass or thyromegaly.     Vascular: No carotid bruit.     Trachea: Trachea normal.  Cardiovascular:     Rate and Rhythm: Normal rate and regular rhythm.     Heart sounds: Normal heart sounds, S1 normal and S2 normal. No murmur heard.    No gallop.  Pulmonary:     Effort: Pulmonary effort is normal. No respiratory distress.     Breath sounds: Normal breath sounds. No wheezing, rhonchi or rales.  Abdominal:     General: Bowel sounds are normal. There is no distension or abdominal bruit.     Palpations: Abdomen is soft. There is no fluid wave or mass.     Tenderness: There is no abdominal tenderness. There is no guarding or rebound.     Hernia: No hernia is present.  Musculoskeletal:     Cervical back: Normal range of motion and neck supple.  Lymphadenopathy:     Cervical: No cervical adenopathy.  Skin:    General: Skin is warm and dry.     Findings: No rash.  Neurological:     Mental Status: She is alert.     Cranial Nerves: No cranial nerve deficit.     Sensory: No sensory deficit.  Psychiatric:        Mood and Affect: Mood is not anxious or depressed.        Speech: Speech normal.        Behavior: Behavior normal. Behavior is cooperative.        Judgment: Judgment normal.       Results for orders placed or performed in visit on 08/05/22  Comprehensive metabolic panel  Result Value Ref Range   Sodium 137 135 - 145 mEq/L   Potassium 4.2 3.5 - 5.1 mEq/L   Chloride 102 96 - 112 mEq/L   CO2 30 19 - 32 mEq/L   Glucose,  Bld 100 (H) 70 - 99 mg/dL   BUN 16 6 - 23  mg/dL   Creatinine, Ser 0.69 0.40 - 1.20 mg/dL   Total Bilirubin 0.6 0.2 - 1.2 mg/dL   Alkaline Phosphatase 77 39 - 117 U/L   AST 22 0 - 37 U/L   ALT 21 0 - 35 U/L   Total Protein 6.4 6.0 - 8.3 g/dL   Albumin 4.2 3.5 - 5.2 g/dL   GFR 91.55 >60.00 mL/min   Calcium 9.1 8.4 - 10.5 mg/dL  Lipid panel  Result Value Ref Range   Cholesterol 165 0 - 200 mg/dL   Triglycerides 66.0 0.0 - 149.0 mg/dL   HDL 52.10 >39.00 mg/dL   VLDL 13.2 0.0 - 40.0 mg/dL   LDL Cholesterol 100 (H) 0 - 99 mg/dL   Total CHOL/HDL Ratio 3    NonHDL 113.14      COVID 19 screen:  No recent travel or known exposure to COVID19 The patient denies respiratory symptoms of COVID 19 at this time. The importance of social distancing was discussed today.   Assessment and Plan   The patient's preventative maintenance and recommended screening tests for an annual wellness exam were reviewed in full today. Brought up to date unless services declined.  Counselled on the importance of diet, exercise, and its role in overall health and mortality. The patient's FH and SH was reviewed, including their home life, tobacco status, and drug and alcohol status.   Vaccines: refused flu,TD ansd shingrix Pap/DVE:  2021 nml, neg HPV Mammo:  DUE, planning to set up Bone Density: will consider Colon:  Cologuard in 2020 Smoking Status: former > 15 years aago ETOH/ drug use: occ/none  Hep C:  done  HIV screen:    Problem List Items Addressed This Visit     Bradycardia     Resolved in last 6 months HR 60s      Chest pain     Calcium CT scoring performed and was elevated.  Started on statin by cardiologist.  She stopped and wanted to treat naturally.  She tolerated statin just made the choice to come off.      Hemifacial spasm     Had intermittent issues with this until it resolved with Mg and ashwaganda.      HLD (hyperlipidemia)     She wishes to treat naturally. Refuses statin despite past calcium CT risk scoring.   This is reasonable given 10 year risk score 5.5%. Encouraged exercise, weight loss, healthy eating habits.        Relevant Medications   aspirin EC 81 MG tablet   Prolapse of vaginal wall    Chronic, bothers her minimally.  In last few months wearing pad given increase in incontinence.      Other Visit Diagnoses     Routine general medical examination at a health care facility    -  Primary        Eliezer Lofts, MD

## 2022-08-12 NOTE — Assessment & Plan Note (Signed)
Resolved in last 6 months HR 60s

## 2022-08-12 NOTE — Patient Instructions (Addendum)
Consider call to have Cologuard ordered after Dec 2023. Please call the location of your choice from the menu below to schedule your Mammogram and/or Bone Density appointment.    [facility]                      Phone:  [phone], [place] [address]                                                             Services: Traditional and 3D Mammogram, [place] [facility]                 Phone: [phone] [address], [place] [address]    Service: Bone Density ONLY   *this site does NOT perform mammograms  [place]                        Phone:  [phone] [address], [place] [address]                                            Services:  3D Mammogram and [place] at [facility]   Phone:  [phone]   [place], [place] [address]                                            Services: 3D Mammogram and [place]  [place] [facility])  Phone:  [phone]   [address] [place], [place] [address]  Services:  3D Mammogram and Bone Density  

## 2022-08-12 NOTE — Assessment & Plan Note (Signed)
Had intermittent issues with this until it resolved with Mg and ashwaganda.

## 2022-08-12 NOTE — Assessment & Plan Note (Signed)
She wishes to treat naturally. Refuses statin despite past calcium CT risk scoring.  This is reasonable given 10 year risk score 5.5%. Encouraged exercise, weight loss, healthy eating habits.

## 2022-08-12 NOTE — Assessment & Plan Note (Signed)
Calcium CT scoring performed and was elevated.  Started on statin by cardiologist.  She stopped and wanted to treat naturally.  She tolerated statin just made the choice to come off.

## 2022-08-12 NOTE — Assessment & Plan Note (Signed)
Chronic, bothers her minimally.  In last few months wearing pad given increase in incontinence.

## 2022-08-25 ENCOUNTER — Encounter: Payer: Self-pay | Admitting: Family Medicine

## 2022-10-31 IMAGING — CT CT CARDIAC CORONARY ARTERY CALCIUM SCORE
3 series · 14 of 20 positions shown, 16 images · non-contrast
Comparison: None.

Addendum:
CLINICAL DATA: Cardiovascular Disease Risk stratification

EXAM:
Coronary Calcium Score
TECHNIQUE: A gated, non-contrast computed tomography scan of the heart was
performed using 3mm slice thickness. Axial images were analyzed on a
dedicated workstation. Calcium scoring of the coronary arteries was
performed using the Agatston method.

[Series 2: sa36 calcium scoring 3.00 · axial · 0.39mm/px · z∈[-1110,-1029]mm · 4 of 46 slices shown]
[im 10/46  vessel]
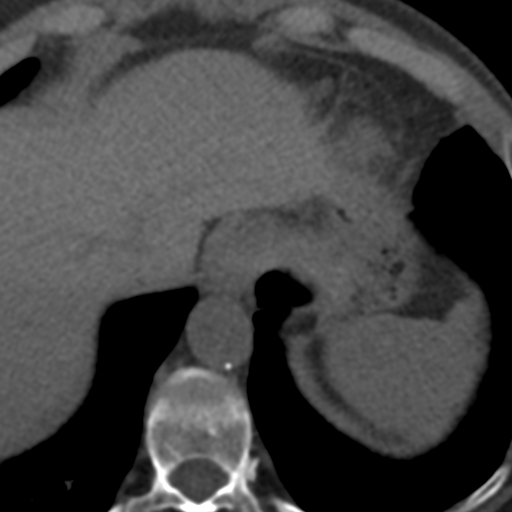
[im 19/46  vessel]
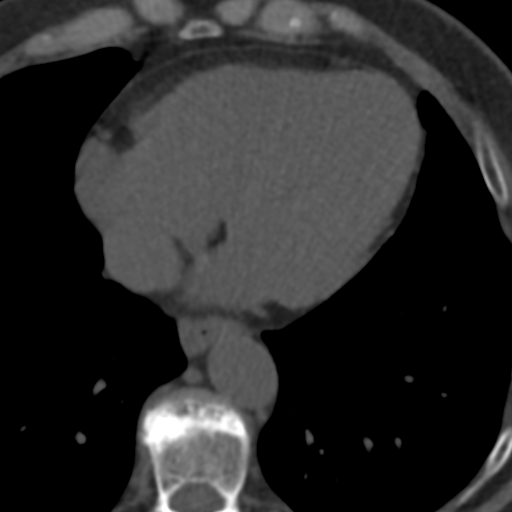
[im 28/46  vessel]
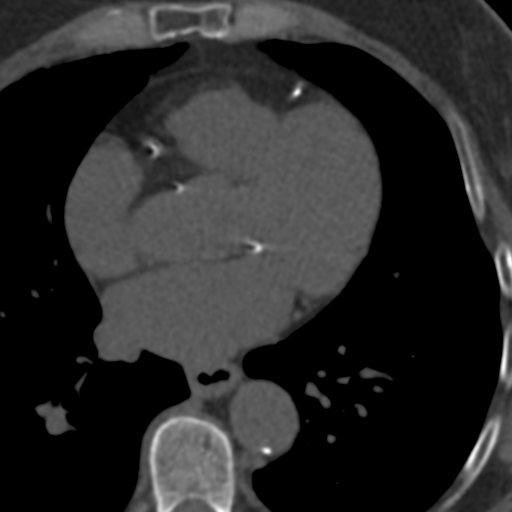
[im 37/46  vessel]
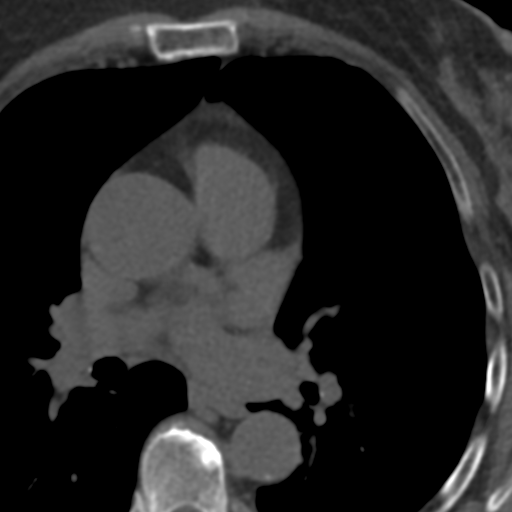

[Series 5: full fov st calcium scoring 3.00 · axial · 0.75mm/px · z∈[-1116,-1026]mm · 5 of 46 slices shown, 7 images]
[im 8/46  vessel]
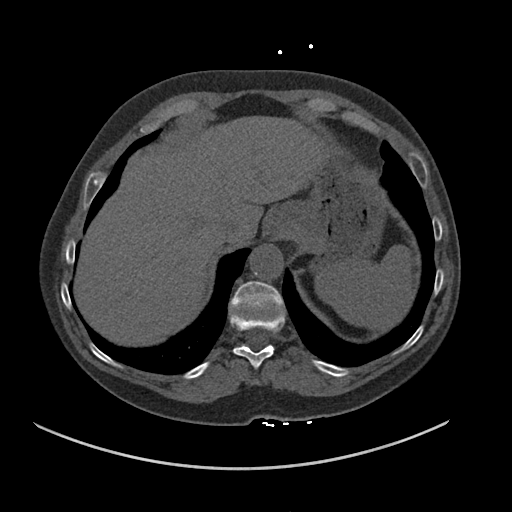
[im 8/46  lung]
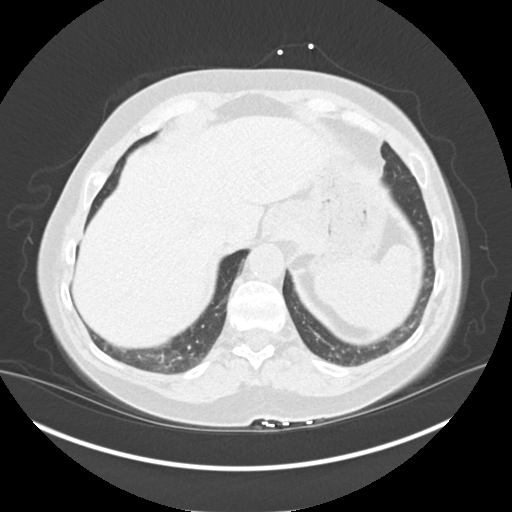
[im 16/46  vessel]
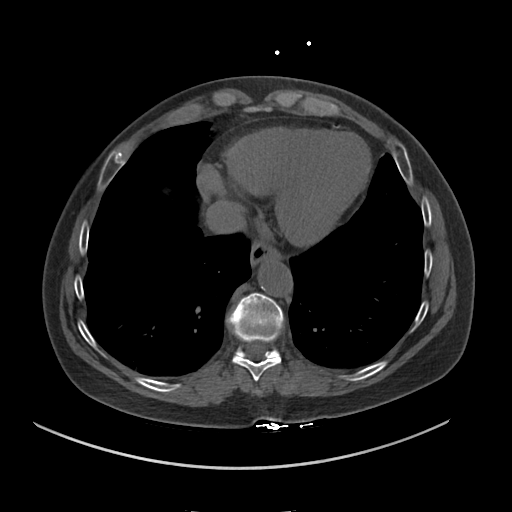
[im 23/46  vessel]
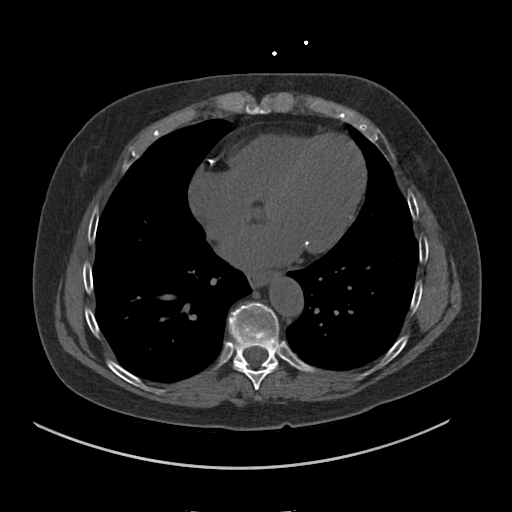
[im 31/46  vessel]
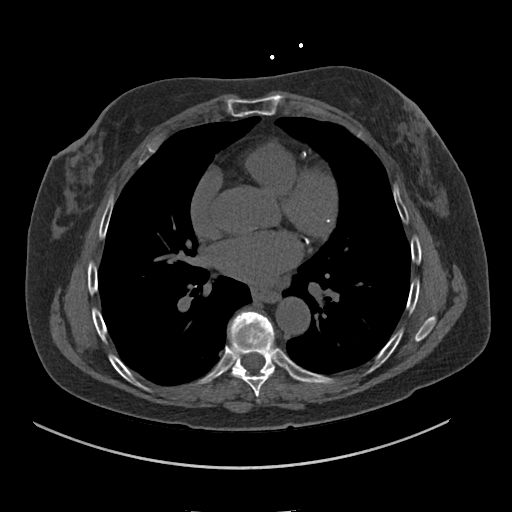
[im 38/46  vessel]
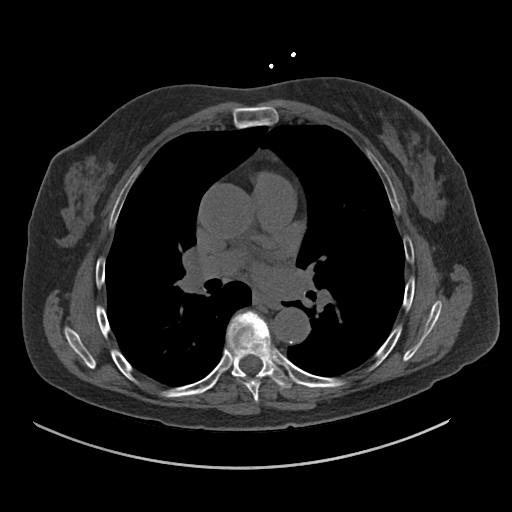
[im 38/46  lung]
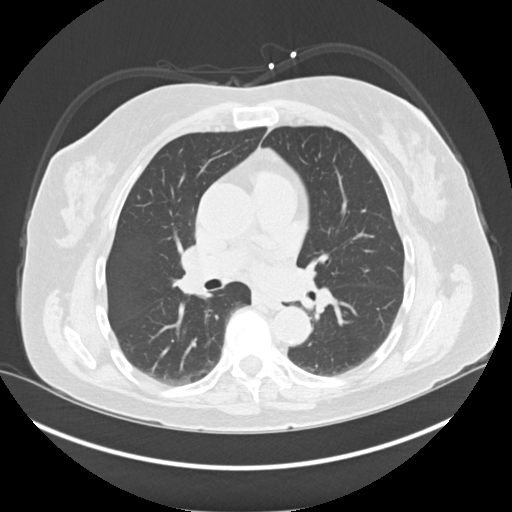

[Series 10: full fov lungs calcium scoring 3.00 ax · axial · 0.75mm/px · z∈[-1116,-1026]mm · 5 of 46 slices shown]
[im 8/46  vessel]
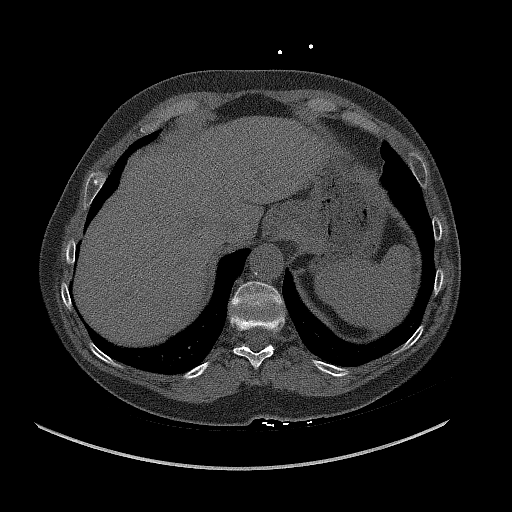
[im 16/46  vessel]
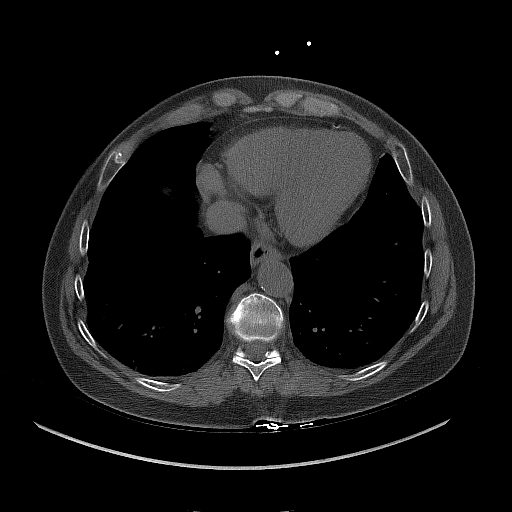
[im 23/46  vessel]
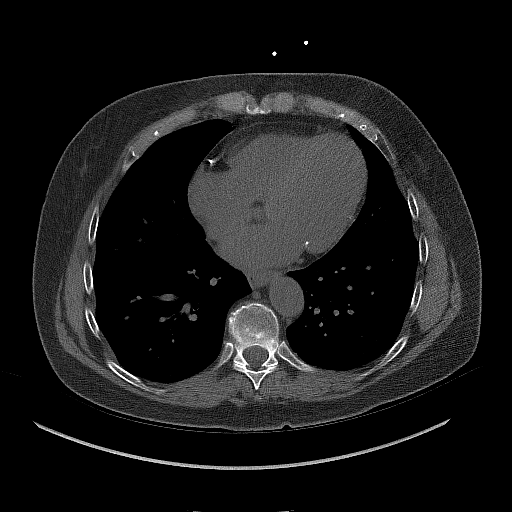
[im 31/46  vessel]
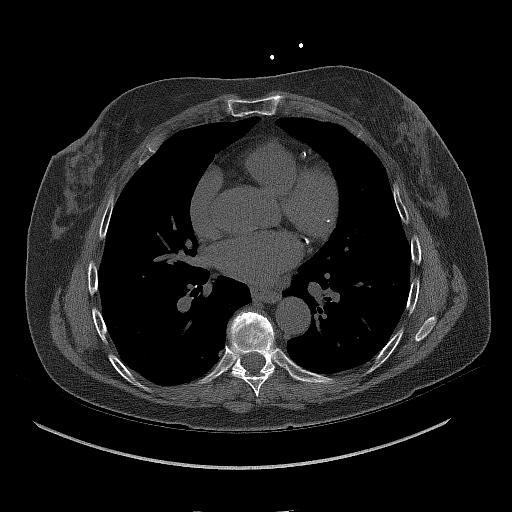
[im 38/46  vessel]
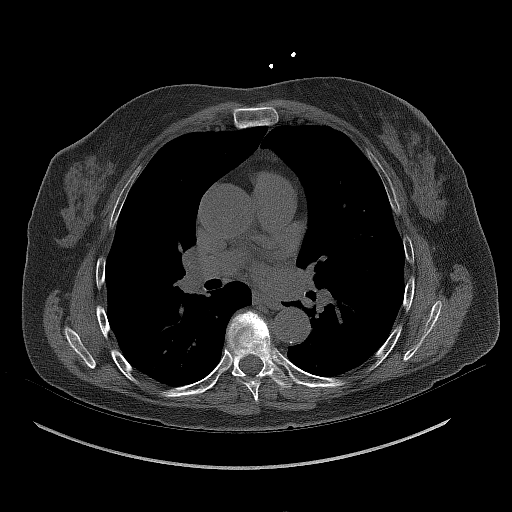

[14 of 20 positions shown; findings below may reference images not displayed]

FINDINGS: Coronary Calcium Score:

Left main: 261

Left anterior descending artery: 787

Left circumflex artery: 215

Right coronary artery: 119

Total: 9686

Percentile: 99

Pericardium: Normal.

Ascending Aorta: Dilated aortic root (41 mm); aortic
atherosclerosis.

Non-cardiac: See separate report from [REDACTED].
IMPRESSION: Coronary calcium score of 9686. This was 99 percentile for age-,
race-, and sex-matched controls.

Dilated aortic root (41 mm).

Aortic atherosclerosis.



If CAC=0, it is reasonable to withhold statin therapy and reassess
in 5 to 10 years, as long as higher risk conditions are absent
(diabetes mellitus, family history of premature CHD in first degree
relatives (males <55 years; females <65 years), cigarette smoking,
or LDL >=190 mg/dL).

If CAC is 1 to 99, it is reasonable to initiate statin therapy for
patients >=55 years of age.

If CAC is >=100 or >=75th percentile, it is reasonable to initiate
statin therapy at any age.

Cardiology referral should be considered for patients with CAC
scores >=400 or >=75th percentile.

*5305 AHA/ACC/AACVPR/AAPA/ABC/FAKETA/ALEX NOE/PREISNER/Sefki/OFRANDIEU DOMINIQUE/KLIZIA/SAHIN-AYLIN
Guideline on the Management of Blood Cholesterol: A Report of the
American College of Cardiology/American Heart Association Task Force
on Clinical Practice Guidelines. J Am Coll Cardiol.
5628;73(24):0392-0596.

EXAM:
OVER-READ INTERPRETATION  CT CHEST

The following report is an over-read performed by radiologist Dr.
over-read does not include interpretation of cardiac or coronary
anatomy or pathology. The calcium score interpretation by the
cardiologist is attached.
FINDINGS: Limited view of the lung parenchyma demonstrates no suspicious
nodularity. Airways are normal.

Limited view of the mediastinum demonstrates no adenopathy.
Esophagus normal.

Limited view of the upper abdomen unremarkable.

Limited view of the skeleton and chest wall is unremarkable.
IMPRESSION: No significant extracardiac findings.

*** End of Addendum ***
FINDINGS: Coronary Calcium Score:

Left main: 261

Left anterior descending artery: 787

Left circumflex artery: 215

Right coronary artery: 119

Total: 9686

Percentile: 99

Pericardium: Normal.

Ascending Aorta: Dilated aortic root (41 mm); aortic
atherosclerosis.

Non-cardiac: See separate report from [REDACTED].
IMPRESSION: Coronary calcium score of 9686. This was 99 percentile for age-,
race-, and sex-matched controls.

Dilated aortic root (41 mm).

Aortic atherosclerosis.



If CAC=0, it is reasonable to withhold statin therapy and reassess
in 5 to 10 years, as long as higher risk conditions are absent
(diabetes mellitus, family history of premature CHD in first degree
relatives (males <55 years; females <65 years), cigarette smoking,
or LDL >=190 mg/dL).

If CAC is 1 to 99, it is reasonable to initiate statin therapy for
patients >=55 years of age.

If CAC is >=100 or >=75th percentile, it is reasonable to initiate
statin therapy at any age.

Cardiology referral should be considered for patients with CAC
scores >=400 or >=75th percentile.

*5305 AHA/ACC/AACVPR/AAPA/ABC/FAKETA/ALEX NOE/PREISNER/Sefki/OFRANDIEU DOMINIQUE/KLIZIA/SAHIN-AYLIN
Guideline on the Management of Blood Cholesterol: A Report of the
American College of Cardiology/American Heart Association Task Force
on Clinical Practice Guidelines. J Am Coll Cardiol.
5628;73(24):0392-0596.

## 2023-04-20 ENCOUNTER — Encounter (HOSPITAL_COMMUNITY): Payer: Self-pay

## 2023-06-06 ENCOUNTER — Ambulatory Visit (HOSPITAL_COMMUNITY): Payer: 59

## 2023-06-16 ENCOUNTER — Ambulatory Visit (HOSPITAL_COMMUNITY): Payer: Medicare Other | Attending: Cardiovascular Disease

## 2023-06-16 DIAGNOSIS — I340 Nonrheumatic mitral (valve) insufficiency: Secondary | ICD-10-CM | POA: Insufficient documentation

## 2023-06-16 LAB — ECHOCARDIOGRAM COMPLETE
Area-P 1/2: 2.71 cm2
MV M vel: 5.48 m/s
MV Peak grad: 119.9 mmHg
S' Lateral: 2.1 cm

## 2023-09-06 ENCOUNTER — Telehealth: Payer: Self-pay | Admitting: *Deleted

## 2023-09-06 DIAGNOSIS — E785 Hyperlipidemia, unspecified: Secondary | ICD-10-CM

## 2023-09-06 NOTE — Telephone Encounter (Signed)
-----   Message from Betty Smith sent at 09/06/2023  3:00 PM EST ----- Regarding: Lab Wed 09/21/23 Hello,  Patient is coming in for CPE labs on Wednesday 09/21/23. Can we get orders please.   Thanks

## 2023-09-16 ENCOUNTER — Encounter: Payer: Self-pay | Admitting: Family Medicine

## 2023-09-21 ENCOUNTER — Other Ambulatory Visit (INDEPENDENT_AMBULATORY_CARE_PROVIDER_SITE_OTHER): Payer: Medicare Other

## 2023-09-21 DIAGNOSIS — E785 Hyperlipidemia, unspecified: Secondary | ICD-10-CM | POA: Diagnosis not present

## 2023-09-21 LAB — COMPREHENSIVE METABOLIC PANEL
ALT: 18 U/L (ref 0–35)
AST: 18 U/L (ref 0–37)
Albumin: 4 g/dL (ref 3.5–5.2)
Alkaline Phosphatase: 91 U/L (ref 39–117)
BUN: 15 mg/dL (ref 6–23)
CO2: 28 meq/L (ref 19–32)
Calcium: 9.1 mg/dL (ref 8.4–10.5)
Chloride: 101 meq/L (ref 96–112)
Creatinine, Ser: 0.66 mg/dL (ref 0.40–1.20)
GFR: 91.8 mL/min (ref >60.00)
Glucose, Bld: 104 mg/dL — ABNORMAL HIGH (ref 70–99)
Potassium: 3.9 meq/L (ref 3.5–5.1)
Sodium: 139 meq/L (ref 135–145)
Total Bilirubin: 0.6 mg/dL (ref 0.2–1.2)
Total Protein: 6.5 g/dL (ref 6.0–8.3)

## 2023-09-21 LAB — LIPID PANEL
Cholesterol: 219 mg/dL — ABNORMAL HIGH (ref 0–200)
HDL: 49.8 mg/dL (ref >39.00)
LDL Cholesterol: 146 mg/dL — ABNORMAL HIGH (ref 0–99)
NonHDL: 168.94
Total CHOL/HDL Ratio: 4
Triglycerides: 117 mg/dL (ref 0.0–149.0)
VLDL: 23.4 mg/dL (ref 0.0–40.0)

## 2023-09-21 NOTE — Progress Notes (Signed)
No critical labs need to be addressed urgently. We will discuss labs in detail at upcoming office visit.   

## 2023-09-23 ENCOUNTER — Other Ambulatory Visit: Payer: Self-pay | Admitting: Family Medicine

## 2023-09-23 DIAGNOSIS — E559 Vitamin D deficiency, unspecified: Secondary | ICD-10-CM

## 2023-09-23 DIAGNOSIS — R5383 Other fatigue: Secondary | ICD-10-CM

## 2023-09-26 NOTE — Addendum Note (Signed)
Addended by: Alvina Chou on: 09/26/2023 07:29 AM   Modules accepted: Orders

## 2023-09-28 ENCOUNTER — Encounter: Payer: Self-pay | Admitting: Family Medicine

## 2023-09-28 ENCOUNTER — Ambulatory Visit: Payer: Medicare Other | Admitting: Family Medicine

## 2023-09-28 VITALS — BP 148/80 | HR 67 | Temp 98.8°F | Ht 62.6 in | Wt 182.6 lb

## 2023-09-28 DIAGNOSIS — E2839 Other primary ovarian failure: Secondary | ICD-10-CM

## 2023-09-28 DIAGNOSIS — I2510 Atherosclerotic heart disease of native coronary artery without angina pectoris: Secondary | ICD-10-CM

## 2023-09-28 DIAGNOSIS — E559 Vitamin D deficiency, unspecified: Secondary | ICD-10-CM

## 2023-09-28 DIAGNOSIS — I340 Nonrheumatic mitral (valve) insufficiency: Secondary | ICD-10-CM | POA: Diagnosis not present

## 2023-09-28 DIAGNOSIS — E785 Hyperlipidemia, unspecified: Secondary | ICD-10-CM

## 2023-09-28 DIAGNOSIS — R002 Palpitations: Secondary | ICD-10-CM

## 2023-09-28 DIAGNOSIS — Z0000 Encounter for general adult medical examination without abnormal findings: Secondary | ICD-10-CM

## 2023-09-28 DIAGNOSIS — Z1211 Encounter for screening for malignant neoplasm of colon: Secondary | ICD-10-CM

## 2023-09-28 DIAGNOSIS — R5383 Other fatigue: Secondary | ICD-10-CM

## 2023-09-28 LAB — CBC WITH DIFFERENTIAL/PLATELET
Basophils Absolute: 0 10*3/uL (ref 0.0–0.1)
Basophils Relative: 0.7 % (ref 0.0–3.0)
Eosinophils Absolute: 0.1 10*3/uL (ref 0.0–0.7)
Eosinophils Relative: 2.9 % (ref 0.0–5.0)
HCT: 42.5 % (ref 36.0–46.0)
Hemoglobin: 14.3 g/dL (ref 12.0–15.0)
Lymphocytes Relative: 21 % (ref 12.0–46.0)
Lymphs Abs: 1 10*3/uL (ref 0.7–4.0)
MCHC: 33.7 g/dL (ref 30.0–36.0)
MCV: 88.8 fL (ref 78.0–100.0)
Monocytes Absolute: 0.6 10*3/uL (ref 0.1–1.0)
Monocytes Relative: 12.2 % — ABNORMAL HIGH (ref 3.0–12.0)
Neutro Abs: 2.9 10*3/uL (ref 1.4–7.7)
Neutrophils Relative %: 63.2 % (ref 43.0–77.0)
Platelets: 244 10*3/uL (ref 150.0–400.0)
RBC: 4.79 Mil/uL (ref 3.87–5.11)
RDW: 13.9 % (ref 11.5–15.5)
WBC: 4.6 10*3/uL (ref 4.0–10.5)

## 2023-09-28 LAB — T4, FREE: Free T4: 0.96 ng/dL (ref 0.60–1.60)

## 2023-09-28 LAB — VITAMIN B12: Vitamin B-12: 630 pg/mL (ref 211–911)

## 2023-09-28 LAB — T3, FREE: T3, Free: 3.3 pg/mL (ref 2.3–4.2)

## 2023-09-28 LAB — VITAMIN D 25 HYDROXY (VIT D DEFICIENCY, FRACTURES): VITD: 38.6 ng/mL (ref 30.00–100.00)

## 2023-09-28 LAB — TSH: TSH: 2.1 u[IU]/mL (ref 0.35–5.50)

## 2023-09-28 NOTE — Assessment & Plan Note (Addendum)
Taking baby Aspirin 81 mg. EKG: normal EKG, normal sinus rhythm, unchanged from previous tracings.   Rarely occurring.. seems to be associated with stress.  Asymptomatic when happening.  Avoid caffeine, ETOH, stress.  Can get [employer] home EKG recorder to try to capture rhythm when happening.  If increasing in frequency, consider Zio monitor.

## 2023-09-28 NOTE — Progress Notes (Signed)
Patient ID: Cortlyn Hegel, female    DOB: 1956-06-07, 67 y.o.   MRN: [id number]  This visit was conducted in person.  BP (!) 148/80   Pulse 67   Temp 98.8 F (37.1 C) (Oral)   Ht 5' 2.6" (1.59 m)   Wt 182 lb 9.6 oz (82.8 kg)   SpO2 96%   BMI 32.76 kg/m    CC:  Chief Complaint  Patient presents with   Annual Exam    Patient has questions about HR increasing when she is moving around and wants to make sure the numbers that she gets are normal    Subjective:   HPI: Auguste Galica is a 67 y.o. female presenting on 09/28/2023 for Annual Exam (Patient has questions about HR increasing when she is moving around and wants to make sure the numbers that she gets are normal)  The patient presents for welcome to  medicare wellness, complete physical and review of chronic health problems. He/She also has the following acute concerns today:  She has noted HR increasing up with slight exertion, feeling heart racing.  Applet watch has noted afib at times... 3 in last 9 months... seems to be during stress. HR 168  She feels like she has been feeling more anxious lately.. has tried Yoga, meditation.   No upcoming OV with Dr. Scharlene Gloss.   History CAD, high calcium CT score.  Mitral valve regarding    09/28/2023   10:56 AM 08/12/2022   10:53 AM 08/12/2020    4:11 PM  Depression screen PHQ 2/9  Decreased Interest 0 0 0  Down, Depressed, Hopeless 1 0 0  PHQ - 2 Score 1 0 0  Altered sleeping 1    Tired, decreased energy 2    Change in appetite 1    Feeling bad or failure about yourself  0    Trouble concentrating 0    Moving slowly or fidgety/restless 0    Suicidal thoughts 0    PHQ-9 Score 5    Difficult doing work/chores Not difficult at all        09/28/2023   10:56 AM  GAD 7 : Generalized Anxiety Score  Nervous, Anxious, on Edge 1  Control/stop worrying 0  Worry too much - different things 0  Trouble relaxing 0  Restless 0  Easily annoyed or irritable 0  Afraid - awful might  happen 0  Total GAD 7 Score 1  Anxiety Difficulty Not difficult at all      I have personally reviewed the Medicare Annual Wellness questionnaire and have noted 1. The patient's medical and social history 2. Their use of alcohol, tobacco or illicit drugs 3. Their current medications and supplements 4. The patient's functional ability including ADL's, fall risks, home safety risks and hearing or visual             impairment. 5. Diet and physical activities 6. Evidence for depression or mood disorders 7.         Updated provider list Cognitive evaluation was performed and recorded on pt medicare questionnaire form. The patients weight, height, BMI and visual acuity have been recorded in the chart   I have made referrals, counseling and provided education to the patient based review of the above and I have provided the pt with a written personalized care plan for preventive services.   Documentation of this information was scanned into the electronic record under the media tab.   Advance directives and end of life planning  reviewed in detail with patient and documented in EMR. Patient given handout on advance care directives if needed. HCPOA and living will updated if needed.  Hearing Screening   500Hz  1000Hz  2000Hz  4000Hz   Right ear 20 20 20 20   Left ear 20 20 20 20    Vision Screening   Right eye Left eye Both eyes  Without correction     With correction 20/25 20/40 20/25     No falls in last 12 months.       Reviewed labs in detail with patient.  Elevated Cholesterol:  was better controlled with diet, but has not been eating heart healthy diet. Lab Results  Component Value Date   CHOL 219 (H) 09/21/2023   HDL 49.80 09/21/2023   LDLCALC 146 (H) 09/21/2023   TRIG 117.0 09/21/2023   CHOLHDL 4 09/21/2023  Using medications without problems: Muscle aches:  Diet compliance: heart healthy Exercise:5 days a week elliptical rowing, bike , weight training. Also Yoga Other  complaints: The 10-year ASCVD risk score (Arnett DK, et al., 2019) is: 8%   Values used to calculate the score:     Age: 82 years     Sex: Female     Is Non-Hispanic African American: No     Diabetic: No     Tobacco smoker: No     Systolic Blood Pressure: 148 mmHg     Is BP treated: No     HDL Cholesterol: 49.8 mg/dL     Total Cholesterol: 219 mg/dL    Relevant past medical, surgical, family and social history reviewed and updated as indicated. Interim medical history since our last visit reviewed. Allergies and medications reviewed and updated. Outpatient Medications Prior to Visit  Medication Sig Dispense Refill   Alpha-Lipoic Acid 600 MG CAPS Take by mouth.     ASHWAGANDHA PO Take 1,400 mg by mouth daily at 12 noon.     aspirin EC 81 MG tablet Take 81 mg by mouth daily. Swallow whole.     BIOTIN PO Take 600 mcg by mouth daily.     Elderberry 575 MG/5ML SYRP Take by mouth daily.     Magnesium 400 MG CAPS Take 400 mg by mouth daily in the afternoon.     Multiple Vitamin (MULTIVITAMIN) tablet Take 1 tablet by mouth daily.     NON FORMULARY Herbal Resistance Liquid     Omega-3 Fatty Acids (OMEGA 3 PO) Take by mouth daily in the afternoon.     Probiotic Product (PROBIOTIC DAILY PO) Take by mouth daily.     CHOLECALCIFEROL PO Take 5,000 Units by mouth daily.     Coenzyme Q10 (COQ10 PO) Take 2 each by mouth daily. W/ turmeric     No facility-administered medications prior to visit.     Per HPI unless specifically indicated in ROS section below Review of Systems  Constitutional:  Negative for fatigue and fever.  HENT:  Negative for congestion.   Eyes:  Negative for pain.  Respiratory:  Negative for cough and shortness of breath.   Cardiovascular:  Negative for chest pain, palpitations and leg swelling.  Gastrointestinal:  Negative for abdominal pain.  Genitourinary:  Negative for dysuria and vaginal bleeding.  Musculoskeletal:  Negative for back pain.  Neurological:  Negative  for syncope, light-headedness and headaches.  Psychiatric/Behavioral:  Negative for dysphoric mood.    Objective:  BP (!) 148/80   Pulse 67   Temp 98.8 F (37.1 C) (Oral)   Ht 5' 2.6" (1.59 m)  Wt 182 lb 9.6 oz (82.8 kg)   SpO2 96%   BMI 32.76 kg/m   Wt Readings from Last 3 Encounters:  09/28/23 182 lb 9.6 oz (82.8 kg)  08/12/22 170 lb 6 oz (77.3 kg)  06/16/22 171 lb 3.2 oz (77.7 kg)      Physical Exam Vitals and nursing note reviewed.  Constitutional:      General: She is not in acute distress.    Appearance: Normal appearance. She is well-developed. She is not ill-appearing or toxic-appearing.  HENT:     Head: Normocephalic.     Right Ear: Hearing, tympanic membrane, ear canal and external ear normal.     Left Ear: Hearing, tympanic membrane, ear canal and external ear normal.     Nose: Nose normal.  Eyes:     General: Lids are normal. Lids are everted, no foreign bodies appreciated.     Conjunctiva/sclera: Conjunctivae normal.     Pupils: Pupils are equal, round, and reactive to light.  Neck:     Thyroid: No thyroid mass or thyromegaly.     Vascular: No carotid bruit.     Trachea: Trachea normal.  Cardiovascular:     Rate and Rhythm: Normal rate and regular rhythm.     Heart sounds: Normal heart sounds, S1 normal and S2 normal. No murmur heard.    No gallop.  Pulmonary:     Effort: Pulmonary effort is normal. No respiratory distress.     Breath sounds: Normal breath sounds. No wheezing, rhonchi or rales.  Abdominal:     General: Bowel sounds are normal. There is no distension or abdominal bruit.     Palpations: Abdomen is soft. There is no fluid wave or mass.     Tenderness: There is no abdominal tenderness. There is no guarding or rebound.     Hernia: No hernia is present.  Musculoskeletal:     Cervical back: Normal range of motion and neck supple.  Lymphadenopathy:     Cervical: No cervical adenopathy.  Skin:    General: Skin is warm and dry.     Findings:  No rash.  Neurological:     Mental Status: She is alert.     Cranial Nerves: No cranial nerve deficit.     Sensory: No sensory deficit.  Psychiatric:        Mood and Affect: Mood is not anxious or depressed.        Speech: Speech normal.        Behavior: Behavior normal. Behavior is cooperative.        Judgment: Judgment normal.       Results for orders placed or performed in visit on 09/21/23  Lipid panel   Collection Time: 09/21/23  9:17 AM  Result Value Ref Range   Cholesterol 219 (H) 0 - 200 mg/dL   Triglycerides 409.8 0.0 - 149.0 mg/dL   HDL 11.91 >47.82 mg/dL   VLDL 95.6 0.0 - 21.3 mg/dL   LDL Cholesterol 086 (H) 0 - 99 mg/dL   Total CHOL/HDL Ratio 4    NonHDL 168.94   Comprehensive metabolic panel   Collection Time: 09/21/23  9:17 AM  Result Value Ref Range   Sodium 139 135 - 145 mEq/L   Potassium 3.9 3.5 - 5.1 mEq/L   Chloride 101 96 - 112 mEq/L   CO2 28 19 - 32 mEq/L   Glucose, Bld 104 (H) 70 - 99 mg/dL   BUN 15 6 - 23 mg/dL   Creatinine, Ser  0.66 0.40 - 1.20 mg/dL   Total Bilirubin 0.6 0.2 - 1.2 mg/dL   Alkaline Phosphatase 91 39 - 117 U/L   AST 18 0 - 37 U/L   ALT 18 0 - 35 U/L   Total Protein 6.5 6.0 - 8.3 g/dL   Albumin 4.0 3.5 - 5.2 g/dL   GFR 40.98 >11.91 mL/min   Calcium 9.1 8.4 - 10.5 mg/dL     COVID 19 screen:  No recent travel or known exposure to COVID19 The patient denies respiratory symptoms of COVID 19 at this time. The importance of social distancing was discussed today.   Assessment and Plan   The patient's preventative maintenance and recommended screening tests for an annual wellness exam were reviewed in full today. Brought up to date unless services declined.  Counselled on the importance of diet, exercise, and its role in overall health and mortality. The patient's FH and SH was reviewed, including their home life, tobacco status, and drug and alcohol status.   Vaccines: refused flu,TD . Prevnar 20 and shingrix Pap/DVE:  2021  nml, neg HPV, no further indicated. Mammo:  DUE, planning to set up Bone Density: will consider.. Colon:  Cologuard in 2020.. wishes to switch to Ifob Smoking Status: former > 15 years aago ETOH/ drug use: occ/none  Hep C:  done  HIV screen:    Problem List Items Addressed This Visit     Coronary artery disease involving native coronary artery of native heart without angina pectoris    On ASA, refused statin.      HLD (hyperlipidemia)    Chronic, inadequate control. Goal < 70  She wishes to treat naturally. Refuses statin despite past calcium CT risk scoring.   Rechec in 3 months Encouraged exercise, weight loss, healthy eating habits.        Mild mitral valve regurgitation   Palpitations    Taking baby Aspirin 81 mg. EKG: normal EKG, normal sinus rhythm, unchanged from previous tracings.   Rarely occurring.. seems to be associated with stress.  Asymptomatic when happening.  Avoid caffeine, ETOH, stress.  Can get [employer] home EKG recorder to try to capture rhythm when happening.  If increasing in frequency, consider Zio monitor.       Relevant Orders   EKG 12-Lead (Completed)   Other Visit Diagnoses       Welcome to Medicare preventive visit    -  Primary     Estrogen deficiency       Relevant Orders   DG Bone Density     Colon cancer screening       Relevant Orders   Fecal occult blood, imunochemical     Other fatigue         Vitamin D deficiency             Kerby Nora, MD

## 2023-09-28 NOTE — Assessment & Plan Note (Addendum)
Chronic, inadequate control. Goal < 70  She wishes to treat naturally. Refuses statin despite past calcium CT risk scoring.   Rechec in 3 months Encouraged exercise, weight loss, healthy eating habits.

## 2023-09-28 NOTE — Patient Instructions (Addendum)
Please stop at the lab to have labs drawn.  Consider  Prevnar 20. Please call the location of your choice from the menu below to schedule your Mammogram and/or Bone Density appointment.    [facility]   [facility]                      Phone:  [phone] [address]                               [place], [place] [address]                                                             Services: Traditional and 3D Mammogram, Bone Density   [facility]                 Phone: [phone] [address]                                                       [place], [place] [address]    Service: Bone Density ONLY   *this site does NOT perform mammograms  [facility]                        Phone:  [phone] [address]                                  [place], [place] [address]                                            Services:  3D Mammogram and Bone Density    [name]   Phone:  [phone]   [address]                                                                            [place], [place] [address]                                            Services: 3D Mammogram and Bone [facility])  Phone:  [phone]   [address]                        [place], [place] [address]  Services:  3D Mammogram and Bone Density

## 2023-09-28 NOTE — Assessment & Plan Note (Signed)
On ASA, refused statin.

## 2023-09-29 ENCOUNTER — Encounter: Payer: Self-pay | Admitting: Family Medicine

## 2023-11-17 ENCOUNTER — Other Ambulatory Visit: Payer: Medicare Other

## 2023-11-17 DIAGNOSIS — Z1211 Encounter for screening for malignant neoplasm of colon: Secondary | ICD-10-CM | POA: Diagnosis not present

## 2023-11-17 LAB — FECAL OCCULT BLOOD, IMMUNOCHEMICAL: Fecal Occult Bld: NEGATIVE

## 2023-11-18 ENCOUNTER — Encounter: Payer: Self-pay | Admitting: Family Medicine

## 2023-12-05 ENCOUNTER — Telehealth: Payer: Self-pay | Admitting: *Deleted

## 2023-12-05 DIAGNOSIS — E785 Hyperlipidemia, unspecified: Secondary | ICD-10-CM

## 2023-12-05 NOTE — Telephone Encounter (Signed)
-----   Message from Alvina Chou sent at 12/05/2023  2:49 PM EST ----- Regarding: Lab orders for TUE, 3.18.25 Lab orders , thanks

## 2023-12-27 ENCOUNTER — Encounter: Payer: Self-pay | Admitting: Family Medicine

## 2023-12-27 ENCOUNTER — Other Ambulatory Visit (INDEPENDENT_AMBULATORY_CARE_PROVIDER_SITE_OTHER): Payer: Medicare Other

## 2023-12-27 DIAGNOSIS — E785 Hyperlipidemia, unspecified: Secondary | ICD-10-CM | POA: Diagnosis not present

## 2023-12-27 LAB — COMPREHENSIVE METABOLIC PANEL
ALT: 22 U/L (ref 0–35)
AST: 21 U/L (ref 0–37)
Albumin: 4.5 g/dL (ref 3.5–5.2)
Alkaline Phosphatase: 97 U/L (ref 39–117)
BUN: 14 mg/dL (ref 6–23)
CO2: 29 meq/L (ref 19–32)
Calcium: 9.6 mg/dL (ref 8.4–10.5)
Chloride: 101 meq/L (ref 96–112)
Creatinine, Ser: 0.66 mg/dL (ref 0.40–1.20)
GFR: 91.63 mL/min (ref >60.00)
Glucose, Bld: 111 mg/dL — ABNORMAL HIGH (ref 70–99)
Potassium: 4.3 meq/L (ref 3.5–5.1)
Sodium: 139 meq/L (ref 135–145)
Total Bilirubin: 0.5 mg/dL (ref 0.2–1.2)
Total Protein: 7.1 g/dL (ref 6.0–8.3)

## 2023-12-27 LAB — LIPID PANEL
Cholesterol: 184 mg/dL (ref 0–200)
HDL: 41.1 mg/dL (ref >39.00)
LDL Cholesterol: 112 mg/dL — ABNORMAL HIGH (ref 0–99)
NonHDL: 142.77
Total CHOL/HDL Ratio: 4
Triglycerides: 152 mg/dL — ABNORMAL HIGH (ref 0.0–149.0)
VLDL: 30.4 mg/dL (ref 0.0–40.0)

## 2024-03-22 ENCOUNTER — Other Ambulatory Visit: Payer: Self-pay | Admitting: Family Medicine

## 2024-03-22 DIAGNOSIS — Z1231 Encounter for screening mammogram for malignant neoplasm of breast: Secondary | ICD-10-CM

## 2024-03-23 ENCOUNTER — Encounter: Payer: Self-pay | Admitting: Family Medicine

## 2024-04-06 ENCOUNTER — Encounter (HOSPITAL_COMMUNITY): Payer: Self-pay | Admitting: Interventional Radiology

## 2024-05-15 ENCOUNTER — Other Ambulatory Visit: Payer: Self-pay | Admitting: Medical Genetics

## 2024-05-21 ENCOUNTER — Ambulatory Visit
Admission: RE | Admit: 2024-05-21 | Discharge: 2024-05-21 | Disposition: A | Discharge status: Home / Self Care | Source: Ambulatory Visit | Attending: Family Medicine | Admitting: Family Medicine

## 2024-05-21 DIAGNOSIS — Z1231 Encounter for screening mammogram for malignant neoplasm of breast: Secondary | ICD-10-CM | POA: Diagnosis present

## 2024-05-21 DIAGNOSIS — E2839 Other primary ovarian failure: Secondary | ICD-10-CM | POA: Diagnosis present

## 2024-05-22 ENCOUNTER — Ambulatory Visit: Payer: Self-pay | Admitting: Family Medicine

## 2024-06-01 ENCOUNTER — Other Ambulatory Visit
Admission: RE | Admit: 2024-06-01 | Discharge: 2024-06-01 | Disposition: A | Discharge status: Home / Self Care | Payer: Self-pay | Source: Ambulatory Visit | Attending: Medical Genetics | Admitting: Medical Genetics

## 2024-06-12 LAB — GENECONNECT MOLECULAR SCREEN: Genetic Analysis Overall Interpretation: NEGATIVE

## 2024-08-19 ENCOUNTER — Other Ambulatory Visit: Payer: Self-pay

## 2024-08-19 ENCOUNTER — Emergency Department

## 2024-08-19 DIAGNOSIS — I480 Paroxysmal atrial fibrillation: Secondary | ICD-10-CM | POA: Insufficient documentation

## 2024-08-19 DIAGNOSIS — I2510 Atherosclerotic heart disease of native coronary artery without angina pectoris: Secondary | ICD-10-CM | POA: Diagnosis not present

## 2024-08-19 DIAGNOSIS — R002 Palpitations: Secondary | ICD-10-CM | POA: Diagnosis present

## 2024-08-19 NOTE — ED Triage Notes (Signed)
 Pt reports earlier tonight she developed some palpitations and became nauseous. Pt checked on her watch and saw her heart rate fluctuating. Pt has hx CAD.

## 2024-08-20 ENCOUNTER — Telehealth: Payer: Self-pay

## 2024-08-20 ENCOUNTER — Emergency Department
Admission: EM | Admit: 2024-08-20 | Discharge: 2024-08-20 | Disposition: A | Discharge status: Home / Self Care | Attending: Emergency Medicine | Admitting: Emergency Medicine

## 2024-08-20 DIAGNOSIS — I480 Paroxysmal atrial fibrillation: Secondary | ICD-10-CM

## 2024-08-20 LAB — BASIC METABOLIC PANEL WITH GFR
Anion gap: 13 (ref 5–15)
BUN: 21 mg/dL (ref 8–23)
CO2: 22 mmol/L (ref 22–32)
Calcium: 9.4 mg/dL (ref 8.9–10.3)
Chloride: 106 mmol/L (ref 98–111)
Creatinine, Ser: 0.86 mg/dL (ref 0.44–1.00)
GFR, Estimated: >60 mL/min (ref >60)
Glucose, Bld: 147 mg/dL — ABNORMAL HIGH (ref 70–99)
Potassium: 4 mmol/L (ref 3.5–5.1)
Sodium: 141 mmol/L (ref 135–145)

## 2024-08-20 LAB — TROPONIN I (HIGH SENSITIVITY)
Troponin I (High Sensitivity): 7 ng/L (ref <18)
Troponin I (High Sensitivity): 9 ng/L (ref <18)

## 2024-08-20 LAB — CBC
HCT: 43.5 % (ref 36.0–46.0)
Hemoglobin: 14.6 g/dL (ref 12.0–15.0)
MCH: 29.4 pg (ref 26.0–34.0)
MCHC: 33.6 g/dL (ref 30.0–36.0)
MCV: 87.7 fL (ref 80.0–100.0)
Platelets: 283 K/uL (ref 150–400)
RBC: 4.96 MIL/uL (ref 3.87–5.11)
RDW: 12.7 % (ref 11.5–15.5)
WBC: 6.8 K/uL (ref 4.0–10.5)
nRBC: 0 % (ref 0.0–0.2)

## 2024-08-20 LAB — MAGNESIUM: Magnesium: 2.3 mg/dL (ref 1.7–2.4)

## 2024-08-20 LAB — TSH: TSH: 6.367 u[IU]/mL — ABNORMAL HIGH (ref 0.350–4.500)

## 2024-08-20 NOTE — Telephone Encounter (Signed)
 Per chart review tab pt went [facility] ED on 08/19/24. Sending note Dr Avelina and [place] pool.

## 2024-08-20 NOTE — ED Provider Notes (Signed)
 [facility] Provider Note    Event Date/Time   First MD Initiated Contact with Patient 08/20/24 0037     (approximate)   History   Palpitations   HPI  Betty Smith is a 68 y.o. female with history of CAD who follows with [facility] cardiology presents with complaints of palpitations.  Patient reports she has been having intermittent palpitations for many months but they seem to have become more frequent over the last 2 weeks.  She reports her Apple Watch reported elevated heart rate and palpitations today and she felt that her heart rate was irregular.  No chest pain     Physical Exam   Triage Vital Signs: ED Triage Vitals  Encounter Vitals Group     BP 08/19/24 2309 (!) 165/117     Girls Systolic BP Percentile --      Girls Diastolic BP Percentile --      Boys Systolic BP Percentile --      Boys Diastolic BP Percentile --      Pulse Rate 08/19/24 2308 78     Resp 08/19/24 2308 19     Temp 08/19/24 2308 98.6 F (37 C)     Temp src --      SpO2 08/19/24 2308 100 %     Weight 08/19/24 2308 81.6 kg (180 lb)     Height 08/19/24 2308 1.575 m (5' 2)     Head Circumference --      Peak Flow --      Pain Score 08/19/24 2308 0     Pain Loc --      Pain Education --      Exclude from Growth Chart --     Most recent vital signs: Vitals:   08/19/24 2308 08/19/24 2309  BP:  (!) 165/117  Pulse: 78   Resp: 19   Temp: 98.6 F (37 C)   SpO2: 100%      General: Awake, no distress.  CV:  Good peripheral perfusion.  Irregular heart rate, normal rate Resp:  Normal effort.  Clear to auscultation Abd:  No distention.  Other:  No calf pain or swelling   ED Results / Procedures / Treatments   Labs (all labs ordered are listed, but only abnormal results are displayed) Labs Reviewed  BASIC METABOLIC PANEL WITH GFR - Abnormal; Notable for the following components:      Result Value   Glucose, Bld 147 (*)    All other components within normal limits   TSH - Abnormal; Notable for the following components:   TSH 6.367 (*)    All other components within normal limits  CBC  MAGNESIUM  TROPONIN I (HIGH SENSITIVITY)  TROPONIN I (HIGH SENSITIVITY)     EKG ED ECG REPORT I, Lamar Price, the attending physician, personally viewed and interpreted this ECG.  Date: 08/20/2024  Rhythm: Atrial fibrillation with RVR QRS Axis: normal Intervals: Abnormal ST/T Wave abnormalities: Nonspecific changes Narrative Interpretation: A-fib    RADIOLOGY Chest x-ray viewed interpret by me, no acute abnormality    PROCEDURES:  Critical Care performed:   Procedures   MEDICATIONS ORDERED IN ED: Medications - No data to display   IMPRESSION / MDM / ASSESSMENT AND PLAN / ED COURSE  I reviewed the triage vital signs and the nursing notes. Patient's presentation is most consistent with acute presentation with potential threat to life or bodily function.  Patient presents with complaints of palpitations, found to be tachycardic upon arrival.  EKG demonstrates atrial fibrillation with RVR, she does not have a diagnosis of atrial fibrillation.  Will obtain labs, IV, placed the patient on cardiac monitor   Patient's heart rate improved without intervention, lab work is all quite reassuring, will repeat EKG, suspicious for paroxysmal atrial fibrillation.  ----------------------------------------- 2:04 AM on 08/20/2024 ----------------------------------------- ED ECG REPORT I, Lamar Price, the attending physician, personally viewed and interpreted this ECG.  Date: 08/20/2024  Rhythm: normal sinus rhythm QRS Axis: normal Intervals: normal ST/T Wave abnormalities: normal Narrative Interpretation: no evidence of acute ischemia  Repeat EKG is unremarkable, she is now in sinus rhythm consistent with a diagnosis of paroxysmal atrial fibrillation.  She would like to discuss with her cardiologist medication prescriptions, she feels well at  this time, no indication for admission, appropriate for discharge   FINAL CLINICAL IMPRESSION(S) / ED DIAGNOSES   Final diagnoses:  Paroxysmal atrial fibrillation with rapid ventricular response (HCC)     Rx / DC Orders   ED Discharge Orders     None        Note:  This document was prepared using Dragon voice recognition software and may include unintentional dictation errors.   Price Lamar, MD 08/20/24 [phone]

## 2024-08-20 NOTE — Telephone Encounter (Signed)
 Betty Smith

## 2024-08-22 ENCOUNTER — Ambulatory Visit: Payer: Self-pay | Admitting: Family Medicine

## 2024-08-22 ENCOUNTER — Ambulatory Visit (INDEPENDENT_AMBULATORY_CARE_PROVIDER_SITE_OTHER): Admitting: Family Medicine

## 2024-08-22 VITALS — BP 142/92 | HR 80 | Temp 98.4°F | Ht 62.0 in | Wt 182.2 lb

## 2024-08-22 DIAGNOSIS — R7989 Other specified abnormal findings of blood chemistry: Secondary | ICD-10-CM | POA: Insufficient documentation

## 2024-08-22 DIAGNOSIS — M810 Age-related osteoporosis without current pathological fracture: Secondary | ICD-10-CM | POA: Insufficient documentation

## 2024-08-22 DIAGNOSIS — E785 Hyperlipidemia, unspecified: Secondary | ICD-10-CM | POA: Diagnosis not present

## 2024-08-22 DIAGNOSIS — E079 Disorder of thyroid, unspecified: Secondary | ICD-10-CM

## 2024-08-22 DIAGNOSIS — B351 Tinea unguium: Secondary | ICD-10-CM | POA: Insufficient documentation

## 2024-08-22 DIAGNOSIS — L821 Other seborrheic keratosis: Secondary | ICD-10-CM | POA: Insufficient documentation

## 2024-08-22 LAB — COMPREHENSIVE METABOLIC PANEL WITH GFR
ALT: 15 U/L (ref 0–35)
AST: 17 U/L (ref 0–37)
Albumin: 4.5 g/dL (ref 3.5–5.2)
Alkaline Phosphatase: 101 U/L (ref 39–117)
BUN: 15 mg/dL (ref 6–23)
CO2: 30 meq/L (ref 19–32)
Calcium: 9.6 mg/dL (ref 8.4–10.5)
Chloride: 99 meq/L (ref 96–112)
Creatinine, Ser: 0.72 mg/dL (ref 0.40–1.20)
GFR: 86.94 mL/min (ref >60.00)
Glucose, Bld: 100 mg/dL — ABNORMAL HIGH (ref 70–99)
Potassium: 4.3 meq/L (ref 3.5–5.1)
Sodium: 138 meq/L (ref 135–145)
Total Bilirubin: 0.8 mg/dL (ref 0.2–1.2)
Total Protein: 6.7 g/dL (ref 6.0–8.3)

## 2024-08-22 LAB — LIPID PANEL
Cholesterol: 168 mg/dL (ref 0–200)
HDL: 41.3 mg/dL (ref >39.00)
LDL Cholesterol: 103 mg/dL — ABNORMAL HIGH (ref 0–99)
NonHDL: 127
Total CHOL/HDL Ratio: 4
Triglycerides: 118 mg/dL (ref 0.0–149.0)
VLDL: 23.6 mg/dL (ref 0.0–40.0)

## 2024-08-22 LAB — T3, FREE: T3, Free: 2.9 pg/mL (ref 2.3–4.2)

## 2024-08-22 LAB — T4, FREE: Free T4: 0.98 ng/dL (ref 0.60–1.60)

## 2024-08-22 LAB — VITAMIN D 25 HYDROXY (VIT D DEFICIENCY, FRACTURES): VITD: 37.55 ng/mL (ref 30.00–100.00)

## 2024-08-22 LAB — TSH: TSH: 1.66 u[IU]/mL (ref 0.35–5.50)

## 2024-08-22 MED ORDER — CICLOPIROX 8 % EX SOLN: Freq: Every day | CUTANEOUS | 3 refills | Status: DC

## 2024-08-22 NOTE — Assessment & Plan Note (Signed)
 Acutely noted at recent ED visit.  Will recheck TSH, free T3 and free T4.

## 2024-08-22 NOTE — Progress Notes (Signed)
 Patient ID: Betty Smith, female    DOB: 06/28/1956, 68 y.o.   MRN: [id number]  This visit was conducted in person.  BP (!) 142/92   Pulse 80   Temp 98.4 F (36.9 C) (Oral)   Ht 5' 2 (1.575 m)   Wt 182 lb 3.2 oz (82.6 kg)   SpO2 97%   BMI 33.32 kg/m    CC:  Chief Complaint  Patient presents with   Follow-up    Discussing bone density results. Wants moles on back to be checked out. Has a few questions. And has two toe nails she wants checked out.     Subjective:   HPI: Betty Smith is a 68 y.o. female presenting on 08/22/2024 for Follow-up (Discussing bone density results. Wants moles on back to be checked out. Has a few questions. And has two toe nails she wants checked out. )  Recent bone density May 21, 2024 showed T-score in the lumbar spine -3.6, left/right femoral neck -1.8/-2.2, total hip -1.5/-1.8 None previous for comparison. Likely new diagnosis osteoporosis. Last vit d nml 09/2023.. very limited calcium  in diet.  GFR >60 She is doing  walking 5 days a week, uses weight vest.  She also has several moles on her back that she wants evaluated.  Some itching on back, one has increased in size. No bleeding.   Recent ED visit for palpitations.. TSH elevated... no free t3 and freet 4 done.  She continued to be tired.   She also notes  Bilateral great toenails.. changing to white... using topical  antifungal.. no improving.   Relevant past medical, surgical, family and social history reviewed and updated as indicated. Interim medical history since our last visit reviewed. Allergies and medications reviewed and updated. Outpatient Medications Prior to Visit  Medication Sig Dispense Refill   Alpha-Lipoic Acid 600 MG CAPS Take by mouth.     ASHWAGANDHA PO Take 1,400 mg by mouth daily at 12 noon.     aspirin EC 81 MG tablet Take 81 mg by mouth daily. Swallow whole.     BIOTIN PO Take 600 mcg by mouth daily.     Elderberry 575 MG/5ML SYRP Take by mouth daily.      Magnesium 400 MG CAPS Take 400 mg by mouth daily in the afternoon.     Multiple Vitamin (MULTIVITAMIN) tablet Take 1 tablet by mouth daily.     NON FORMULARY Herbal Resistance Liquid     Omega-3 Fatty Acids (OMEGA 3 PO) Take by mouth daily in the afternoon.     Probiotic Product (PROBIOTIC DAILY PO) Take by mouth daily.     No facility-administered medications prior to visit.     Per HPI unless specifically indicated in ROS section below Review of Systems  Constitutional:  Negative for fatigue and fever.  HENT:  Negative for congestion.   Eyes:  Negative for pain.  Respiratory:  Negative for cough and shortness of breath.   Cardiovascular:  Negative for chest pain, palpitations and leg swelling.  Gastrointestinal:  Negative for abdominal pain.  Genitourinary:  Negative for dysuria and vaginal bleeding.  Musculoskeletal:  Negative for back pain.  Neurological:  Negative for syncope, light-headedness and headaches.  Psychiatric/Behavioral:  Negative for dysphoric mood.    Objective:  BP (!) 142/92   Pulse 80   Temp 98.4 F (36.9 C) (Oral)   Ht 5' 2 (1.575 m)   Wt 182 lb 3.2 oz (82.6 kg)   SpO2 97%   BMI  33.32 kg/m   Wt Readings from Last 3 Encounters:  08/22/24 182 lb 3.2 oz (82.6 kg)  08/19/24 180 lb (81.6 kg)  09/28/23 182 lb 9.6 oz (82.8 kg)      Physical Exam Constitutional:      General: She is not in acute distress.    Appearance: Normal appearance. She is well-developed. She is not ill-appearing or toxic-appearing.  HENT:     Head: Normocephalic.     Right Ear: Hearing, tympanic membrane, ear canal and external ear normal. Tympanic membrane is not erythematous, retracted or bulging.     Left Ear: Hearing, tympanic membrane, ear canal and external ear normal. Tympanic membrane is not erythematous, retracted or bulging.     Nose: No mucosal edema or rhinorrhea.     Right Sinus: No maxillary sinus tenderness or frontal sinus tenderness.     Left Sinus: No  maxillary sinus tenderness or frontal sinus tenderness.     Mouth/Throat:     Pharynx: Uvula midline.  Eyes:     General: Lids are normal. Lids are everted, no foreign bodies appreciated.     Conjunctiva/sclera: Conjunctivae normal.     Pupils: Pupils are equal, round, and reactive to light.  Neck:     Thyroid : No thyroid  mass or thyromegaly.     Vascular: No carotid bruit.     Trachea: Trachea normal.  Cardiovascular:     Rate and Rhythm: Normal rate and regular rhythm.     Pulses: Normal pulses.     Heart sounds: Normal heart sounds, S1 normal and S2 normal. No murmur heard.    No friction rub. No gallop.  Pulmonary:     Effort: Pulmonary effort is normal. No tachypnea or respiratory distress.     Breath sounds: Normal breath sounds. No decreased breath sounds, wheezing, rhonchi or rales.  Abdominal:     General: Bowel sounds are normal.     Palpations: Abdomen is soft.     Tenderness: There is no abdominal tenderness.  Musculoskeletal:     Cervical back: Normal range of motion and neck supple.  Skin:    General: Skin is warm and dry.     Findings: Lesion present. No rash.     Comments: Several SKs on torso, 1 at bra line mildly irritated Thickening and discoloration of left first digit toenail with mild amount of subungual debris, slight whitening of right great toenail  Neurological:     Mental Status: She is alert.  Psychiatric:        Mood and Affect: Mood is not anxious or depressed.        Speech: Speech normal.        Behavior: Behavior normal. Behavior is cooperative.        Thought Content: Thought content normal.        Judgment: Judgment normal.       Results for orders placed or performed during the hospital encounter of 08/20/24  Basic metabolic panel   Collection Time: 08/19/24 11:10 PM  Result Value Ref Range   Sodium 141 135 - 145 mmol/L   Potassium 4.0 3.5 - 5.1 mmol/L   Chloride 106 98 - 111 mmol/L   CO2 22 22 - 32 mmol/L   Glucose, Bld 147 (H) 70  - 99 mg/dL   BUN 21 8 - 23 mg/dL   Creatinine, Ser 9.13 0.44 - 1.00 mg/dL   Calcium  9.4 8.9 - 10.3 mg/dL   GFR, Estimated >39 >39 mL/min   Anion  gap 13 5 - 15  CBC   Collection Time: 08/19/24 11:10 PM  Result Value Ref Range   WBC 6.8 4.0 - 10.5 K/uL   RBC 4.96 3.87 - 5.11 MIL/uL   Hemoglobin 14.6 12.0 - 15.0 g/dL   HCT 56.4 63.9 - 53.9 %   MCV 87.7 80.0 - 100.0 fL   MCH 29.4 26.0 - 34.0 pg   MCHC 33.6 30.0 - 36.0 g/dL   RDW 87.2 88.4 - 84.4 %   Platelets 283 150 - 400 K/uL   nRBC 0.0 0.0 - 0.2 %  Troponin I (High Sensitivity)   Collection Time: 08/19/24 11:10 PM  Result Value Ref Range   Troponin I (High Sensitivity) 7 <18 ng/L  Magnesium   Collection Time: 08/19/24 11:33 PM  Result Value Ref Range   Magnesium 2.3 1.7 - 2.4 mg/dL  TSH   Collection Time: 08/19/24 11:33 PM  Result Value Ref Range   TSH 6.367 (H) 0.350 - 4.500 uIU/mL  Troponin I (High Sensitivity)   Collection Time: 08/20/24 12:52 AM  Result Value Ref Range   Troponin I (High Sensitivity) 9 <18 ng/L    Assessment and Plan  Age-related osteoporosis without current pathological fracture Assessment & Plan: New diagnosis, most significant in the lumbar spine. Recommend weight bearing exercise, calcium  in diet and vit D supplement 400 IU 1-2 times daily. Will evaluate vitamin D  level.  Evaluate thyroid  function further as discussed below. Discussed possible treatment options including bisphosphonate.  She will consider.  Information provided.  Orders: -     VITAMIN D  25 Hydroxy (Vit-D Deficiency, Fractures) -     Comprehensive metabolic panel with GFR  Elevated TSH Assessment & Plan: Acutely noted at recent ED visit.  Will recheck TSH, free T3 and free T4.  Orders: -     TSH -     T4, free -     T3, free  Hyperlipidemia, unspecified hyperlipidemia type -     Lipid panel  Thyroid  disorder -     TSH  Thyroid  condition -     T4, free  Seborrheic keratoses Assessment & Plan: Acute, minimally  irritated.  Patient reassured no further treatment needed.   Fungal toenail infection Assessment & Plan: New, discussed topical versus oral treatment options.  She is not interested in oral treatment at this time given possible liver irritation side effect.  She is currently using an over-the-counter medication.  I have encouraged her to try topical Penlac long-term.  Discussed length of time needed for effect is upwards of 12 months.   Other orders -     Ciclopirox; Apply topically at bedtime. Apply over nail and surrounding skin. Apply daily over previous coat. After seven (7) days, may remove with alcohol and continue cycle.  Dispense: 6.6 mL; Refill: 3    No follow-ups on file.   Greig Ring, MD

## 2024-08-22 NOTE — Patient Instructions (Signed)
 Alendronate Weekly Tablets What is this medication? ALENDRONATE (a LEN droe nate) treats osteoporosis. It works by Interior and spatial designer stronger and less likely to break (fracture). It belongs to a group of medications called bisphosphonates. This medicine may be used for other purposes; ask your health care provider or pharmacist if you have questions. COMMON BRAND NAME(S): Fosamax What should I tell my care team before I take this medication? They need to know if you have any of these conditions: Bleeding disorder Cancer Dental disease Difficulty swallowing Infection (fever, chills, cough, sore throat, pain or trouble passing urine) Kidney disease Low levels of calcium or other minerals in the blood Low red blood cell counts Receiving steroids like dexamethasone or prednisone Stomach or intestine problems Trouble sitting or standing for 30 minutes An unusual or allergic reaction to alendronate, other medications, foods, dyes or preservatives Pregnant or trying to get pregnant Breast-feeding How should I use this medication? Take this medication by mouth with a full glass of water. Take it as directed on the prescription label at the same day of each week. Take the dose right after waking up. Do not eat or drink anything before taking it. Do not take it with any other drink except water. Do not chew or crush the tablet. After taking it, do not eat breakfast, drink, or take any other medications or vitamins for at least 30 minutes. Sit or stand up for at least 30 minutes after you take it. Do not lie down. Keep taking it unless your care team tells you to stop. A special MedGuide will be given to you by the pharmacist with each prescription and refill. Be sure to read this information carefully each time. Talk to your care team about the use of this medication in children. Special care may be needed. Overdosage: If you think you have taken too much of this medicine contact a poison control  center or emergency room at once. NOTE: This medicine is only for you. Do not share this medicine with others. What if I miss a dose? If you take your medication once a day, skip it. Take your next dose at the scheduled time the next morning. Do not take two doses on the same day. If you take your medication once a week, take the missed dose on the morning after you remember. Do not take two doses on the same day. What may interact with this medication? Aluminum hydroxide Antacids Aspirin Calcium supplements Iron supplements Medications for inflammation like ibuprofen, naproxen, and others Magnesium supplements Vitamins with minerals This list may not describe all possible interactions. Give your health care provider a list of all the medicines, herbs, non-prescription drugs, or dietary supplements you use. Also tell them if you smoke, drink alcohol, or use illegal drugs. Some items may interact with your medicine. What should I watch for while using this medication? Visit your care team for regular checks on your progress. It may be some time before you see the benefit from this medication. Some people who take this medication have severe bone, joint, or muscle pain. This medication may also increase your risk for jaw problems or a broken thigh bone. Tell your care team right away if you have severe pain in your jaw, bones, joints, or muscles. Tell you care team if you have any pain that does not go away or that gets worse. Tell your dentist and dental surgeon that you are taking this medication. You should not have major dental surgery while on  this medication. See your dentist to have a dental exam and fix any dental problems before starting this medication. Take good care of your teeth while on this medication. Make sure you see your dentist for regular follow-up appointments. You should make sure you get enough calcium and vitamin D while you are taking this medication. Discuss the foods you  eat and the vitamins you take with your care team. You may need blood work done while you are taking this medication. What side effects may I notice from receiving this medication? Side effects that you should report to your care team as soon as possible: Allergic reactions--skin rash, itching, hives, swelling of the face, lips, tongue, or throat Low calcium level--muscle pain or cramps, confusion, tingling, or numbness in the hands or feet Osteonecrosis of the jaw--pain, swelling, or redness in the mouth, numbness of the jaw, poor healing after dental work, unusual discharge from the mouth, visible bones in the mouth Pain or trouble swallowing Severe bone, joint, or muscle pain Stomach bleeding--bloody or black, tar-like stools, vomiting blood or brown material that looks like coffee grounds Side effects that usually do not require medical attention (report to your care team if they continue or are bothersome): Constipation Diarrhea Nausea Stomach pain This list may not describe all possible side effects. Call your doctor for medical advice about side effects. You may report side effects to FDA at 1-800-FDA-1088. Where should I keep my medication? Keep out of the reach of children and pets. Store at room temperature between 15 and 30 degrees C (59 and 86 degrees F). Throw away any unused medication after the expiration date. NOTE: This sheet is a summary. It may not cover all possible information. If you have questions about this medicine, talk to your doctor, pharmacist, or health care provider.  2024 Elsevier/Gold Standard (2020-09-22 00:00:00)

## 2024-08-22 NOTE — Assessment & Plan Note (Signed)
 New diagnosis, most significant in the lumbar spine. Recommend weight bearing exercise, calcium  in diet and vit D supplement 400 IU 1-2 times daily. Will evaluate vitamin D  level.  Evaluate thyroid  function further as discussed below. Discussed possible treatment options including bisphosphonate.  She will consider.  Information provided.

## 2024-08-22 NOTE — Assessment & Plan Note (Signed)
 New, discussed topical versus oral treatment options.  She is not interested in oral treatment at this time given possible liver irritation side effect.  She is currently using an over-the-counter medication.  I have encouraged her to try topical Penlac long-term.  Discussed length of time needed for effect is upwards of 12 months.

## 2024-08-22 NOTE — Assessment & Plan Note (Signed)
 Acute, minimally irritated.  Patient reassured no further treatment needed.

## 2024-09-05 ENCOUNTER — Encounter: Payer: Self-pay | Admitting: *Deleted

## 2024-09-05 NOTE — Telephone Encounter (Signed)
-----   Message from Betty Smith sent at 09/05/2024 10:54 AM EST ----- Regarding: Labs Fri 09/21/24 Hello,  Patient is coming in for CPE labs. Can we get orders please.   Thanks

## 2024-09-05 NOTE — Telephone Encounter (Signed)
 This encounter was created in error - please disregard.

## 2024-09-21 ENCOUNTER — Other Ambulatory Visit: Payer: Medicare Other

## 2024-09-28 ENCOUNTER — Encounter: Payer: Self-pay | Admitting: Family Medicine

## 2024-09-28 ENCOUNTER — Ambulatory Visit (INDEPENDENT_AMBULATORY_CARE_PROVIDER_SITE_OTHER): Payer: Medicare Other | Admitting: Family Medicine

## 2024-09-28 VITALS — BP 130/84 | HR 72 | Temp 99.4°F | Ht 62.75 in | Wt 180.1 lb

## 2024-09-28 DIAGNOSIS — Z1211 Encounter for screening for malignant neoplasm of colon: Secondary | ICD-10-CM

## 2024-09-28 DIAGNOSIS — E785 Hyperlipidemia, unspecified: Secondary | ICD-10-CM | POA: Diagnosis not present

## 2024-09-28 DIAGNOSIS — Z0000 Encounter for general adult medical examination without abnormal findings: Secondary | ICD-10-CM | POA: Diagnosis not present

## 2024-09-28 DIAGNOSIS — I480 Paroxysmal atrial fibrillation: Secondary | ICD-10-CM

## 2024-09-28 DIAGNOSIS — M810 Age-related osteoporosis without current pathological fracture: Secondary | ICD-10-CM | POA: Diagnosis not present

## 2024-09-28 DIAGNOSIS — R7989 Other specified abnormal findings of blood chemistry: Secondary | ICD-10-CM | POA: Diagnosis not present

## 2024-09-28 NOTE — Assessment & Plan Note (Signed)
"   ON recheck.. normal.. on no medicaiton. "

## 2024-09-28 NOTE — Addendum Note (Signed)
 Addended by: WENDELL ARLAND RAMAN on: 09/28/2024 11:26 AM   Modules accepted: Orders

## 2024-09-28 NOTE — Addendum Note (Signed)
 Addended by: WENDELL ARLAND RAMAN on: 09/28/2024 11:38 AM   Modules accepted: Orders

## 2024-09-28 NOTE — Assessment & Plan Note (Signed)
"   Chronic, inadequate control. Goal < 70  She wishes to treat naturally. Refuses statin despite past calcium  CT risk scoring.   Recheck in 3 months Encouraged exercise, weight loss, healthy eating habits.   "

## 2024-09-28 NOTE — Assessment & Plan Note (Signed)
 New diagnosis, most significant in the lumbar spine. Recommend weight bearing exercise, calcium  in diet and vit D supplement 400 IU 1-2 times daily. itamin D level nml.    Discussed possible treatment options including bisphosphonate.  She will consider.  Information provided.

## 2024-09-28 NOTE — Progress Notes (Signed)
 "   Patient ID: Betty Smith, female    DOB: 1956/09/16, 68 y.o.   MRN: [id number]  This visit was conducted in person.  BP 130/84   Pulse 72   Temp 99.4 F (37.4 C) (Temporal)   Ht 5' 2.75 (1.594 m)   Wt 180 lb 2 oz (81.7 kg)   SpO2 97%   BMI 32.16 kg/m    CC:  Chief Complaint  Patient presents with   Medicare Wellness    Subjective:   HPI: Betty Smith is a 68 y.o. female presenting on 09/28/2024 for Medicare Wellness  The patient presents for 1st medicare wellness, complete physical and review of chronic health problems. He/She also has the following acute concerns today:  08/20/2024.. paroxsysmal afib... no issues since  Has follow up cardiology early in 2026 Dr. Barbaraann  No CP., NO SOB.  I have personally reviewed the Medicare Annual Wellness questionnaire and have noted 1. The patient's medical and social history 2. Their use of alcohol, tobacco or illicit drugs 3. Their current medications and supplements 4. The patient's functional ability including ADL's, fall risks, home safety risks and hearing or visual             impairment. 5. Diet and physical activities 6. Evidence for depression or mood disorders 7.         Updated provider list Cognitive evaluation was performed and recorded on pt medicare questionnaire form. The patients weight, height, BMI and visual acuity have been recorded in the chart   I have made referrals, counseling and provided education to the patient based review of the above and I have provided the pt with a written personalized care plan for preventive services.   Documentation of this information was scanned into the electronic record under the media tab.   Advance directives and end of life planning reviewed in detail with patient and documented in EMR. Patient given handout on advance care directives if needed. HCPOA and living will updated if needed.  Vision Screening - Comments:: Wears Glasses-Yearly Eye exam with [facility] in  [place]  No falls in last 12 months.    Cardiology Dr. Burton.   History CAD, high calcium  CT score.  Mitral valve regarding    09/28/2024   10:08 AM 08/22/2024    9:12 AM 09/28/2023   10:56 AM  Depression screen PHQ 2/9  Decreased Interest 1 1 0  Down, Depressed, Hopeless 0 0 1  PHQ - 2 Score 1 1 1   Altered sleeping   1  Tired, decreased energy   2  Change in appetite   1  Feeling bad or failure about yourself    0  Trouble concentrating   0  Moving slowly or fidgety/restless   0  Suicidal thoughts   0  PHQ-9 Score   5   Difficult doing work/chores   Not difficult at all     Data saved with a previous flowsheet row definition      08/22/2024    9:12 AM 09/28/2023   10:56 AM  GAD 7 : Generalized Anxiety Score  Nervous, Anxious, on Edge 1 1  Control/stop worrying 0 0  Worry too much - different things 0 0  Trouble relaxing 1 0  Restless 0 0  Easily annoyed or irritable 0 0  Afraid - awful might happen 0 0  Total GAD 7 Score 2 1  Anxiety Difficulty Not difficult at all Not difficult at all     Reviewed labs in  detail with patient.  Elevated Cholesterol:  Improving control on red yeast rice controlled with diet, but has not been eating heart healthy diet. Lab Results  Component Value Date   CHOL 168 08/22/2024   HDL 41.30 08/22/2024   LDLCALC 103 (H) 08/22/2024   TRIG 118.0 08/22/2024   CHOLHDL 4 08/22/2024  Using medications without problems: Muscle aches:  Diet compliance: heart healthy Exercise:5 days a week elliptical rowing, bike , weight training. Also Yoga Other complaints: The 10-year ASCVD risk score (Arnett DK, et al., 2019) is: 6.5%   Values used to calculate the score:     Age: 48 years     Clinically relevant sex: Female     Is Non-Hispanic African American: No     Diabetic: No     Tobacco smoker: No     Systolic Blood Pressure: 130 mmHg     Is BP treated: No     HDL Cholesterol: 41.3 mg/dL     Total Cholesterol: 168  mg/dL    Relevant past medical, surgical, family and social history reviewed and updated as indicated. Interim medical history since our last visit reviewed. Allergies and medications reviewed and updated. Outpatient Medications Prior to Visit  Medication Sig Dispense Refill   Alpha-Lipoic Acid 600 MG CAPS Take by mouth.     ASHWAGANDHA PO Take 1,400 mg by mouth daily at 12 noon.     aspirin EC 81 MG tablet Take 81 mg by mouth daily. Swallow whole.     BIOTIN PO Take 600 mcg by mouth daily.     Elderberry 575 MG/5ML SYRP Take by mouth daily.     Magnesium 400 MG CAPS Take 400 mg by mouth daily in the afternoon.     NON FORMULARY Herbal Resistance Liquid     Omega-3 Fatty Acids (OMEGA 3 PO) Take by mouth daily in the afternoon.     Probiotic Product (PROBIOTIC DAILY PO) Take by mouth daily.     ciclopirox  (PENLAC ) 8 % solution Apply topically at bedtime. Apply over nail and surrounding skin. Apply daily over previous coat. After seven (7) days, may remove with alcohol and continue cycle. 6.6 mL 3   Multiple Vitamin (MULTIVITAMIN) tablet Take 1 tablet by mouth daily.     No facility-administered medications prior to visit.     Per HPI unless specifically indicated in ROS section below Review of Systems  Constitutional:  Negative for fatigue and fever.  HENT:  Negative for congestion.   Eyes:  Negative for pain.  Respiratory:  Negative for cough and shortness of breath.   Cardiovascular:  Negative for chest pain, palpitations and leg swelling.  Gastrointestinal:  Negative for abdominal pain.  Genitourinary:  Negative for dysuria and vaginal bleeding.  Musculoskeletal:  Negative for back pain.  Neurological:  Negative for syncope, light-headedness and headaches.  Psychiatric/Behavioral:  Negative for dysphoric mood.    Objective:  BP 130/84   Pulse 72   Temp 99.4 F (37.4 C) (Temporal)   Ht 5' 2.75 (1.594 m)   Wt 180 lb 2 oz (81.7 kg)   SpO2 97%   BMI 32.16 kg/m   Wt  Readings from Last 3 Encounters:  09/28/24 180 lb 2 oz (81.7 kg)  08/22/24 182 lb 3.2 oz (82.6 kg)  08/19/24 180 lb (81.6 kg)      Physical Exam Vitals and nursing note reviewed.  Constitutional:      General: She is not in acute distress.    Appearance: Normal appearance.  She is well-developed. She is not ill-appearing or toxic-appearing.  HENT:     Head: Normocephalic.     Right Ear: Hearing, tympanic membrane, ear canal and external ear normal.     Left Ear: Hearing, tympanic membrane, ear canal and external ear normal.     Nose: Nose normal.  Eyes:     General: Lids are normal. Lids are everted, no foreign bodies appreciated.     Conjunctiva/sclera: Conjunctivae normal.     Pupils: Pupils are equal, round, and reactive to light.  Neck:     Thyroid : No thyroid  mass or thyromegaly.     Vascular: No carotid bruit.     Trachea: Trachea normal.  Cardiovascular:     Rate and Rhythm: Normal rate and regular rhythm.     Heart sounds: Normal heart sounds, S1 normal and S2 normal. No murmur heard.    No gallop.  Pulmonary:     Effort: Pulmonary effort is normal. No respiratory distress.     Breath sounds: Normal breath sounds. No wheezing, rhonchi or rales.  Abdominal:     General: Bowel sounds are normal. There is no distension or abdominal bruit.     Palpations: Abdomen is soft. There is no fluid wave or mass.     Tenderness: There is no abdominal tenderness. There is no guarding or rebound.     Hernia: No hernia is present.  Musculoskeletal:     Cervical back: Normal range of motion and neck supple.  Lymphadenopathy:     Cervical: No cervical adenopathy.  Skin:    General: Skin is warm and dry.     Findings: No rash.  Neurological:     Mental Status: She is alert.     Cranial Nerves: No cranial nerve deficit.     Sensory: No sensory deficit.  Psychiatric:        Mood and Affect: Mood is not anxious or depressed.        Speech: Speech normal.        Behavior: Behavior  normal. Behavior is cooperative.        Judgment: Judgment normal.       Results for orders placed or performed in visit on 08/22/24  TSH   Collection Time: 08/22/24 10:31 AM  Result Value Ref Range   TSH 1.66 0.35 - 5.50 uIU/mL  T4, free   Collection Time: 08/22/24 10:31 AM  Result Value Ref Range   Free T4 0.98 0.60 - 1.60 ng/dL  T3, free   Collection Time: 08/22/24 10:31 AM  Result Value Ref Range   T3, Free 2.9 2.3 - 4.2 pg/mL  VITAMIN D  25 Hydroxy (Vit-D Deficiency, Fractures)   Collection Time: 08/22/24 10:31 AM  Result Value Ref Range   VITD 37.55 30.00 - 100.00 ng/mL  Lipid panel   Collection Time: 08/22/24 10:31 AM  Result Value Ref Range   Cholesterol 168 0 - 200 mg/dL   Triglycerides 881.9 0.0 - 149.0 mg/dL   HDL 58.69 >60.99 mg/dL   VLDL 76.3 0.0 - 59.9 mg/dL   LDL Cholesterol 896 (H) 0 - 99 mg/dL   Total CHOL/HDL Ratio 4    NonHDL 127.00   Comprehensive metabolic panel with GFR   Collection Time: 08/22/24 10:31 AM  Result Value Ref Range   Sodium 138 135 - 145 mEq/L   Potassium 4.3 3.5 - 5.1 mEq/L   Chloride 99 96 - 112 mEq/L   CO2 30 19 - 32 mEq/L   Glucose, Bld 100 (  H) 70 - 99 mg/dL   BUN 15 6 - 23 mg/dL   Creatinine, Ser 9.27 0.40 - 1.20 mg/dL   Total Bilirubin 0.8 0.2 - 1.2 mg/dL   Alkaline Phosphatase 101 39 - 117 U/L   AST 17 0 - 37 U/L   ALT 15 0 - 35 U/L   Total Protein 6.7 6.0 - 8.3 g/dL   Albumin 4.5 3.5 - 5.2 g/dL   GFR 13.05 >39.99 mL/min   Calcium  9.6 8.4 - 10.5 mg/dL     COVID 19 screen:  No recent travel or known exposure to COVID19 The patient denies respiratory symptoms of COVID 19 at this time. The importance of social distancing was discussed today.   Assessment and Plan   The patient's preventative maintenance and recommended screening tests for an annual wellness exam were reviewed in full today. Brought up to date unless services declined.  Counselled on the importance of diet, exercise, and its role in overall health  and mortality. The patient's FH and SH was reviewed, including their home life, tobacco status, and drug and alcohol status.   Vaccines: refused flu,TD . Prevnar 20 and shingrix Pap/DVE:  2021 nml, neg HPV, no further indicated. Mammo:  05/2024 Bone Density: 05/21/2024, osteoporosis,  she refuses  med to treat.repeat in 2 years. Colon:  Cologuard in 2020.. wishes to switch to ifob., negative 11/2023 Smoking Status: former > 15 years aago ETOH/ drug use: occ/none  Hep C:  done  HIV screen:    Problem List Items Addressed This Visit     Age-related osteoporosis without current pathological fracture   New diagnosis, most significant in the lumbar spine. Recommend weight bearing exercise, calcium  in diet and vit D supplement 400 IU 1-2 times daily. itamin D level nml.    Discussed possible treatment options including bisphosphonate.  She will consider.  Information provided.      Elevated TSH    ON recheck.. normal.. on no medicaiton.      HLD (hyperlipidemia)    Chronic, inadequate control. Goal < 70  She wishes to treat naturally. Refuses statin despite past calcium  CT risk scoring.   Recheck in 3 months Encouraged exercise, weight loss, healthy eating habits.        Paroxysmal A-fib (HCC)    New diagnosis, no further episodes.  Has upcoming OV with Cardiology to discuss possible anticoagulation vs ablation etc.       Other Visit Diagnoses       Medicare annual wellness visit, initial    -  Primary     Colon cancer screening       Relevant Orders   Cologuard          Greig Ring, MD   "

## 2024-09-28 NOTE — Assessment & Plan Note (Addendum)
"   New diagnosis, no further episodes.  Has upcoming OV with Cardiology to discuss possible anticoagulation vs ablation etc.  "

## 2024-10-05 ENCOUNTER — Ambulatory Visit: Admitting: Cardiovascular Disease

## 2024-10-22 NOTE — Progress Notes (Signed)
 " Cardiology Office Note:  .   Date:  10/30/2024  ID:  [name], DOB [birth year], MRN [id number] PCP: Avelina Greig BRAVO, MD  [facility] Providers Cardiologist:  None Electrophysiologist:  OLE ONEIDA HOLTS, MD (Inactive)   History of Present Illness: .    Chief Complaint  Patient presents with   Follow-up    Betty Smith is a 69 y.o. female with below history who presents for follow-up.   History of Present Illness   Betty Smith is a 69 year old female with coronary artery disease and paroxysmal atrial fibrillation who presents for follow-up.  She has a history of coronary artery disease with an elevated coronary calcium  score of 1383. PET imaging revealed a small reversible apical perfusion defect. She experiences no symptoms of angina. Her most recent LDL cholesterol level was elevated, but she prefers to manage her cholesterol naturally and is not taking any cholesterol medication at this time.  She was diagnosed with paroxysmal atrial fibrillation following an episode in November, characterized by a rapid heart rate fluctuating between 158 and 64 bpm. She attributes the episode to stress related to family issues and has not experienced any further episodes since then. She uses an Apple Watch to monitor her heart rate, although the EKG function is not currently working. No chest pain or trouble breathing is reported.  She has moderate mitral valve regurgitation, noted in September 2025. She reports no symptoms related to this condition.  She mentions experiencing stress due to family matters over the past two years, which she believes may have contributed to her AFib episode. She is actively working on managing her stress levels.           Problem List CAC -Score 1383, 99th percentile 2. HLD -T chol 168, HDL 41, LDL 103, TG 118 3. Mitral regurgitation  -moderate 2024 4. Ascending aorta dilation  -40 mm 2024 5. Paroxysmal Afib  -Dx 08/19/2024 -CHADSVASC=3  (age, CAD, female)    ROS: All other ROS reviewed and negative. Pertinent positives noted in the HPI.     Studies Reviewed: .       NM PET 04-25-22 Narrative & Impression      Findings are consistent with ischemia. Small reversible perfusion defect at apex/apical inferior wall suggesting distal LAD territory ischemia.  While there is normal global and LAD territory myocardial blood flow reserve, this is likely due to area of ischemia being small. The study is low risk.   LV perfusion is abnormal. There is evidence of ischemia. Defect 1: There is a small defect with moderate reduction in uptake present in the apical inferior and apex location(s) that is reversible. There is abnormal wall motion in the defect area. Consistent with ischemia.   Rest left ventricular function is normal. Rest EF: 60 %. Stress left ventricular function is normal. Stress EF: 72 %. End diastolic cavity size is normal. End systolic cavity size is normal.   Myocardial blood flow was computed to be 1.39ml/g/min at rest and 3.47ml/g/min at stress. Global myocardial blood flow reserve was 3.62 and was normal.   Coronary calcium  was present on the attenuation correction CT images. Severe coronary calcifications were present. Coronary calcifications were present in the left anterior descending artery, left circumflex artery and right coronary artery distribution(s).   Electronically signed by Lonni Nanas, MD   TTE 06/16/2023  1. Left ventricular ejection fraction, by estimation, is 65 to 70%. The  left ventricle has normal function. The left ventricle has  no regional  wall motion abnormalities. There is mild asymmetric left ventricular  hypertrophy of the basal-septal segment.  Left ventricular diastolic parameters are consistent with Grade I  diastolic dysfunction (impaired relaxation).   2. Right ventricular systolic function is normal. The right ventricular  size is normal.   3. The mitral valve is normal in  structure. Moderate mitral valve  regurgitation. No evidence of mitral stenosis.   4. The aortic valve is tricuspid. There is mild calcification of the  aortic valve. Aortic valve regurgitation is not visualized. Aortic valve  sclerosis/calcification is present, without any evidence of aortic  stenosis.   5. Aortic dilatation noted. There is mild dilatation of the ascending  aorta, measuring 40 mm.   6. The inferior vena cava is normal in size with greater than 50%  respiratory variability, suggesting right atrial pressure of 3 mmHg.   Physical Exam:   VS:  BP (!) 148/87 (BP Location: Left Arm, Patient Position: Sitting)   Pulse 91   Ht 5' 2.5 (1.588 m)   Wt 184 lb 9.6 oz (83.7 kg)   SpO2 98%   BMI 33.23 kg/m    Wt Readings from Last 3 Encounters:  10/30/24 184 lb 9.6 oz (83.7 kg)  09/28/24 180 lb 2 oz (81.7 kg)  08/22/24 182 lb 3.2 oz (82.6 kg)    GEN: Well nourished, well developed in no acute distress NECK: No JVD; No carotid bruits CARDIAC: RRR, no murmurs, rubs, gallops RESPIRATORY:  Clear to auscultation without rales, wheezing or rhonchi  ABDOMEN: Soft, non-tender, non-distended EXTREMITIES:  No edema; No deformity  ASSESSMENT AND PLAN: .   Assessment and Plan    Paroxysmal atrial fibrillation Recent episode likely stress-induced. CHADS-VASc score 3, increased stroke risk. Prefers natural approach over anticoagulation. Discussed stroke risks and anticoagulation benefits. Explained Eliquis and Xarelto reduce stroke risk. Discussed ablation and Watchman device. - Ordered two-week heart monitor for AFib recurrence. - Prescribed metoprolol  tartrate 25 mg PRN for heart rate >120 bpm, every 4-6 hours. - Discussed anticoagulation options (Eliquis, Xarelto) and encouraged consideration. - Encouraged obtaining new EKG device for home monitoring.  Coronary artery disease without angina Elevated coronary calcium  score of 1383. No angina symptoms. On aspirin therapy. - Continue  aspirin therapy.  Mixed hyperlipidemia Recent elevated LDL levels. Prefers natural approach. - Continue natural approach to cholesterol management.  Mitral valve regurgitation Moderate regurgitation on echocardiogram. No symptoms or murmur. - Continue monitoring mitral valve regurgitation.  Elevated blood pressure Slightly elevated blood pressure. - Continue to monitor blood pressure.                Follow-up: Return in about 6 months (around 04/29/2025).  Signed, Darryle DASEN. Barbaraann, MD, [facility]  [facility]  [address] [place], [place] [address] [phone]  3:40 PM   "

## 2024-10-30 ENCOUNTER — Ambulatory Visit

## 2024-10-30 ENCOUNTER — Encounter: Payer: Self-pay | Admitting: Cardiovascular Disease

## 2024-10-30 ENCOUNTER — Ambulatory Visit: Payer: Self-pay | Attending: Cardiovascular Disease | Admitting: Cardiovascular Disease

## 2024-10-30 VITALS — BP 148/87 | HR 91 | Ht 62.5 in | Wt 184.6 lb

## 2024-10-30 DIAGNOSIS — E782 Mixed hyperlipidemia: Secondary | ICD-10-CM | POA: Diagnosis not present

## 2024-10-30 DIAGNOSIS — I2510 Atherosclerotic heart disease of native coronary artery without angina pectoris: Secondary | ICD-10-CM | POA: Diagnosis not present

## 2024-10-30 DIAGNOSIS — I480 Paroxysmal atrial fibrillation: Secondary | ICD-10-CM | POA: Diagnosis not present

## 2024-10-30 DIAGNOSIS — I77819 Aortic ectasia, unspecified site: Secondary | ICD-10-CM

## 2024-10-30 DIAGNOSIS — I340 Nonrheumatic mitral (valve) insufficiency: Secondary | ICD-10-CM | POA: Diagnosis not present

## 2024-10-30 MED ORDER — METOPROLOL TARTRATE 25 MG PO TABS: 25.0000 mg | ORAL_TABLET | Freq: Four times a day (QID) | ORAL | 3 refills | Status: AC | PRN

## 2024-10-30 NOTE — Progress Notes (Unsigned)
 Enrolled for [employer] to mail a ZIO XT long term holter monitor to the patients address on file.

## 2024-10-30 NOTE — Patient Instructions (Addendum)
 Medication Instructions:  Metoprolol  Tartrate 25 mg Every 4-6 hours as needed for heart rate 120 or more Medications for Anticoagulation: Xarelto Eliquis *If you need a refill on your cardiac medications before your next appointment, please call your pharmacy*  Lab Work: None ordered If you have labs (blood work) drawn today and your tests are completely normal, you will receive your results only by: MyChart Message (if you have MyChart) OR A paper copy in the mail If you have any lab test that is abnormal or we need to change your treatment, we will call you to review the results.  Testing/Procedures: Your physician has recommended that you wear a 14 DAY ZIO-PATCH monitor. The Zio patch cardiac monitor continuously records heart rhythm data for up to 14 days, this is for patients being evaluated for multiple types heart rhythms. For the first 24 hours post application, please avoid getting the Zio monitor wet in the shower or by excessive sweating during exercise. After that, feel free to carry on with regular activities. Keep soaps and lotions away from the ZIO XT Patch.  This will be mailed to you, please expect 7-10 days to receive.    Applying the monitor   Shave hair from upper left chest.   Hold abrader disc by orange tab.  Rub abrader in 40 strokes over left upper chest as indicated in your monitor instructions.   Clean area with 4 enclosed alcohol pads .  Use all pads to assure are is cleaned thoroughly.  Let dry.   Apply patch as indicated in monitor instructions.  Patch will be place under collarbone on left side of chest with arrow pointing upward.   Rub patch adhesive wings for 2 minutes.Remove white label marked 1.  Remove white label marked 2.  Rub patch adhesive wings for 2 additional minutes.   While looking in a mirror, press and release button in center of patch.  A small green light will flash 3-4 times .  This will be your only indicator the monitor has been  turned on.     Do not shower for the first 24 hours.  You may shower after the first 24 hours.   Press button if you feel a symptom. You will hear a small click.  Record Date, Time and Symptom in the Patient Log Book.   When you are ready to remove patch, follow instructions on last 2 pages of Patient Log Book.  Stick patch monitor onto last page of Patient Log Book.   Place Patient Log Book in [place] box.  Use locking tab on box and tape box closed securely.  The [employer] and [employer] has [employer] on it.  Please place in mailbox as soon as possible.  Your physician should have your test results approximately 7 days after the monitor has been mailed back to [facility].   Call [facility] Customer Care at [phone] if you have questions regarding your ZIO XT patch monitor.  Call them immediately if you see an orange light blinking on your monitor.   If your monitor falls off in less than 4 days contact our Monitor department at [phone].  If your monitor becomes loose or falls off after 4 days call [employer] at [phone] for suggestions on securing your monitor   Follow-Up: At [facility], you and your health needs are our priority.  As part of our continuing mission to provide you with exceptional heart care, our providers are all part of one team.  This team includes your primary Cardiologist (physician) and Advanced Practice Providers or APPs (Physician Assistants and Nurse Practitioners) who all work together to provide you with the care you need, when you need it.  Your next appointment:   6 month(s)  Provider:   Dr Barbaraann  We recommend signing up for the patient portal called MyChart.  Sign up information is provided on this After Visit Summary.  MyChart is used to connect with patients for Virtual Visits (Telemedicine).  Patients are able to view lab/test results, encounter notes, upcoming appointments, etc.  Non-urgent messages can be sent to your  provider as well.   To learn more about what you can do with MyChart, go to [email or web address].

## 2024-11-01 LAB — COLOGUARD: COLOGUARD: POSITIVE — AB

## 2024-11-02 ENCOUNTER — Ambulatory Visit: Payer: Self-pay | Admitting: Family Medicine
